# Patient Record
Sex: Female | Born: 1950 | Race: White | Hispanic: No | Marital: Married | Smoking: Never smoker
Health system: Southern US, Community
[De-identification: ages and names within clinical notes are randomized; demographics above are authoritative.]

## PROBLEM LIST (undated history)

## (undated) DIAGNOSIS — I10 Essential (primary) hypertension: Secondary | ICD-10-CM

## (undated) DIAGNOSIS — M79601 Pain in right arm: Secondary | ICD-10-CM

## (undated) DIAGNOSIS — C801 Malignant (primary) neoplasm, unspecified: Secondary | ICD-10-CM

## (undated) DIAGNOSIS — Z853 Personal history of malignant neoplasm of breast: Secondary | ICD-10-CM

## (undated) DIAGNOSIS — Z87820 Personal history of traumatic brain injury: Secondary | ICD-10-CM

## (undated) DIAGNOSIS — E049 Nontoxic goiter, unspecified: Secondary | ICD-10-CM

## (undated) DIAGNOSIS — N840 Polyp of corpus uteri: Secondary | ICD-10-CM

## (undated) DIAGNOSIS — Z9221 Personal history of antineoplastic chemotherapy: Secondary | ICD-10-CM

## (undated) DIAGNOSIS — C50919 Malignant neoplasm of unspecified site of unspecified female breast: Secondary | ICD-10-CM

## (undated) DIAGNOSIS — Z923 Personal history of irradiation: Secondary | ICD-10-CM

---

## 1975-07-03 HISTORY — PX: TUBAL LIGATION: SHX77

## 1997-07-02 DIAGNOSIS — Z923 Personal history of irradiation: Secondary | ICD-10-CM

## 1997-07-02 DIAGNOSIS — Z9221 Personal history of antineoplastic chemotherapy: Secondary | ICD-10-CM

## 1997-07-02 DIAGNOSIS — C50919 Malignant neoplasm of unspecified site of unspecified female breast: Secondary | ICD-10-CM

## 1997-07-02 HISTORY — PX: BREAST BIOPSY: SHX20

## 1997-07-02 HISTORY — PX: BREAST LUMPECTOMY: SHX2

## 1997-07-02 HISTORY — DX: Personal history of irradiation: Z92.3

## 1997-07-02 HISTORY — DX: Malignant neoplasm of unspecified site of unspecified female breast: C50.919

## 1997-07-02 HISTORY — PX: BREAST LUMPECTOMY WITH AXILLARY LYMPH NODE DISSECTION: SHX5756

## 1997-07-02 HISTORY — DX: Personal history of antineoplastic chemotherapy: Z92.21

## 2004-06-13 ENCOUNTER — Ambulatory Visit: Payer: Self-pay | Admitting: Gastroenterology

## 2005-10-31 ENCOUNTER — Ambulatory Visit: Payer: Self-pay | Admitting: Internal Medicine

## 2006-05-06 ENCOUNTER — Ambulatory Visit: Payer: Self-pay | Admitting: Internal Medicine

## 2006-12-03 ENCOUNTER — Ambulatory Visit (HOSPITAL_COMMUNITY): Admission: RE | Admit: 2006-12-03 | Discharge: 2006-12-03 | Payer: Self-pay | Admitting: Obstetrics and Gynecology

## 2006-12-11 ENCOUNTER — Encounter: Admission: RE | Admit: 2006-12-11 | Discharge: 2006-12-11 | Payer: Self-pay | Admitting: Obstetrics and Gynecology

## 2007-02-19 ENCOUNTER — Encounter: Admission: RE | Admit: 2007-02-19 | Discharge: 2007-02-19 | Payer: Self-pay | Admitting: Surgery

## 2007-02-19 ENCOUNTER — Encounter (INDEPENDENT_AMBULATORY_CARE_PROVIDER_SITE_OTHER): Payer: Self-pay | Admitting: Interventional Radiology

## 2007-02-19 ENCOUNTER — Other Ambulatory Visit: Admission: RE | Admit: 2007-02-19 | Discharge: 2007-02-19 | Payer: Self-pay | Admitting: Interventional Radiology

## 2007-06-16 ENCOUNTER — Ambulatory Visit: Payer: Self-pay | Admitting: Gastroenterology

## 2007-12-12 ENCOUNTER — Encounter: Admission: RE | Admit: 2007-12-12 | Discharge: 2007-12-12 | Payer: Self-pay | Admitting: Obstetrics and Gynecology

## 2008-06-16 ENCOUNTER — Ambulatory Visit (HOSPITAL_COMMUNITY): Admission: RE | Admit: 2008-06-16 | Discharge: 2008-06-16 | Payer: Self-pay | Admitting: Obstetrics and Gynecology

## 2008-07-14 ENCOUNTER — Ambulatory Visit: Payer: Self-pay | Admitting: Obstetrics & Gynecology

## 2008-08-02 ENCOUNTER — Ambulatory Visit: Payer: Self-pay | Admitting: Obstetrics & Gynecology

## 2008-12-20 ENCOUNTER — Encounter: Admission: RE | Admit: 2008-12-20 | Discharge: 2008-12-20 | Payer: Self-pay | Admitting: Obstetrics and Gynecology

## 2008-12-23 ENCOUNTER — Encounter: Admission: RE | Admit: 2008-12-23 | Discharge: 2008-12-23 | Payer: Self-pay | Admitting: Obstetrics and Gynecology

## 2009-03-25 DIAGNOSIS — E7800 Pure hypercholesterolemia, unspecified: Secondary | ICD-10-CM | POA: Insufficient documentation

## 2010-02-03 ENCOUNTER — Encounter: Admission: RE | Admit: 2010-02-03 | Discharge: 2010-02-03 | Payer: Self-pay | Admitting: Obstetrics and Gynecology

## 2010-02-09 ENCOUNTER — Encounter: Admission: RE | Admit: 2010-02-09 | Discharge: 2010-02-09 | Payer: Self-pay | Admitting: Obstetrics and Gynecology

## 2010-07-24 ENCOUNTER — Encounter: Payer: Self-pay | Admitting: Obstetrics and Gynecology

## 2011-03-06 ENCOUNTER — Ambulatory Visit: Payer: Self-pay | Admitting: Family Medicine

## 2012-04-30 ENCOUNTER — Ambulatory Visit: Payer: Self-pay | Admitting: Family Medicine

## 2013-05-05 ENCOUNTER — Ambulatory Visit: Payer: Self-pay | Admitting: Family Medicine

## 2014-03-15 DIAGNOSIS — R05 Cough: Secondary | ICD-10-CM | POA: Diagnosis not present

## 2014-03-15 DIAGNOSIS — Z23 Encounter for immunization: Secondary | ICD-10-CM | POA: Diagnosis not present

## 2014-03-15 DIAGNOSIS — R059 Cough, unspecified: Secondary | ICD-10-CM | POA: Diagnosis not present

## 2014-03-15 DIAGNOSIS — Z1331 Encounter for screening for depression: Secondary | ICD-10-CM | POA: Diagnosis not present

## 2014-04-22 DIAGNOSIS — Z23 Encounter for immunization: Secondary | ICD-10-CM | POA: Diagnosis not present

## 2014-05-06 ENCOUNTER — Ambulatory Visit: Payer: Self-pay | Admitting: Family Medicine

## 2014-05-06 DIAGNOSIS — Z853 Personal history of malignant neoplasm of breast: Secondary | ICD-10-CM | POA: Diagnosis not present

## 2014-05-06 DIAGNOSIS — Z1231 Encounter for screening mammogram for malignant neoplasm of breast: Secondary | ICD-10-CM | POA: Diagnosis not present

## 2014-05-20 ENCOUNTER — Other Ambulatory Visit: Payer: Self-pay | Admitting: Obstetrics and Gynecology

## 2014-05-20 DIAGNOSIS — N950 Postmenopausal bleeding: Secondary | ICD-10-CM | POA: Diagnosis not present

## 2014-05-20 DIAGNOSIS — Z206 Contact with and (suspected) exposure to human immunodeficiency virus [HIV]: Secondary | ICD-10-CM | POA: Diagnosis not present

## 2014-05-20 DIAGNOSIS — Z124 Encounter for screening for malignant neoplasm of cervix: Secondary | ICD-10-CM | POA: Diagnosis not present

## 2014-05-20 DIAGNOSIS — Z113 Encounter for screening for infections with a predominantly sexual mode of transmission: Secondary | ICD-10-CM | POA: Diagnosis not present

## 2014-05-25 LAB — CYTOLOGY - PAP

## 2014-06-02 ENCOUNTER — Other Ambulatory Visit (HOSPITAL_COMMUNITY): Payer: Self-pay | Admitting: Obstetrics and Gynecology

## 2014-06-02 DIAGNOSIS — E041 Nontoxic single thyroid nodule: Secondary | ICD-10-CM

## 2014-06-03 DIAGNOSIS — R490 Dysphonia: Secondary | ICD-10-CM | POA: Diagnosis not present

## 2014-06-08 ENCOUNTER — Ambulatory Visit (HOSPITAL_COMMUNITY): Payer: Self-pay

## 2014-06-10 DIAGNOSIS — N950 Postmenopausal bleeding: Secondary | ICD-10-CM | POA: Diagnosis not present

## 2014-07-14 DIAGNOSIS — N840 Polyp of corpus uteri: Secondary | ICD-10-CM | POA: Diagnosis not present

## 2014-07-16 ENCOUNTER — Encounter (HOSPITAL_BASED_OUTPATIENT_CLINIC_OR_DEPARTMENT_OTHER): Payer: Self-pay | Admitting: *Deleted

## 2014-07-16 NOTE — Progress Notes (Signed)
NPO AFTER MN. ARRIVE AT 0700. NEEDS HG. PRE-OP ORDERS PENDING.

## 2014-07-19 NOTE — H&P (Signed)
  Patient name  Bianca Brown, Bianca Brown DICTATION# [id number] CSN# [id number]  [facility], MD 07/19/2014 8:46 AM

## 2014-07-20 DIAGNOSIS — M25511 Pain in right shoulder: Secondary | ICD-10-CM | POA: Diagnosis not present

## 2014-07-20 DIAGNOSIS — M542 Cervicalgia: Secondary | ICD-10-CM | POA: Diagnosis not present
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
## 2014-07-20 NOTE — H&P (Signed)
NAMEAVALEY, COOP NO.:  [id number]  MEDICAL RECORD NO.:  [id number]  LOCATION:                                 FACILITY:  PHYSICIAN:  Darlyn Chamber, M.D.   DATE OF BIRTH:  1950/11/25  DATE OF ADMISSION:  07/21/2014 DATE OF DISCHARGE:                             HISTORY & PHYSICAL   HISTORY:  The patient is a 64 year old postmenopausal patient, presents for hysteroscopy for resection of a large endometrial polyp.  In relation to the present admission, the patient reported postmenopausal bleeding.  She subsequently underwent a saline infusion ultrasound revealed a large endometrial polyp.  She now presents for hysteroscopic evaluation and resection with MyoSure.  ALLERGIES:  No known drug allergies.  MEDICATIONS:  None.  PAST MEDICAL HISTORY:  She has a history of urinary tract infections. Otherwise, no significant medical illnesses.  PREVIOUS SURGERY:  In 1999, she had a history of breast cancer with a lumpectomy.  She had a D and C in 2002 for abnormal bleeding.  She had a previous bilateral tubal ligation in 1977.  FAMILY HISTORY:  Significant in that she has 2 maternal aunts with breast cancer and 1 maternal aunt with ovarian cancer.  SOCIAL HISTORY:  Reveals no tobacco or alcohol use.  REVIEW OF SYSTEMS:  Noncontributory.  PHYSICAL EXAMINATION:  VITAL SIGNS:  The patient is afebrile, stable vital signs.  HEENT:  Pupils equal, round, reactive to light and accommodation.  Extraocular movements were intact.  Sclerae and conjunctivae are clear.  Oropharynx clear. NECK:  Left-sided thyroid nodule, previously evaluated. BREASTS:  Not examined. LUNGS:  Clear. CARDIOVASCULAR SYSTEM:  Regular rate.  No murmurs or gallops.  No carotid or abdominal bruits. ABDOMEN:  Benign.  No masses, organomegaly, or tenderness. PELVIC:  Normal external genitalia.  Vaginal mucosa is clear.  She has a prominent rectocele.  Mild cystocele.  Cervix unremarkable.   Uterus: Normal size, shape, and contour.  Adnexa, free of masses or tenderness.  IMPRESSION: 1. Postmenopausal bleeding with endometrial polyp. 2. Thyroid nodule. 3. Family history of both breast and ovarian cancer. 4. Pelvic relaxation.  PLAN:  At the present time, the patient will undergo cervical dilatation with hysteroscopy and resection with MyoSure.  She also had uterine curetting.  The nature of the procedure and risks have been explained including the risk of infection.  The risk of hemorrhage that could require transfusion with the risk of AIDS or hepatitis.  Excessive bleeding could require hysterectomy.  Risk of perforation with injury to adjacent organs, requiring laparoscopy and possible exploratory laparotomy.  Risk of deep venous thrombosis and pulmonary embolus.  The patient expressed understanding of indications and risks.     Darlyn Chamber, M.D.     JSM/MEDQ  D:  07/19/2014  T:  07/19/2014  Job:  [id number]

## 2014-07-21 ENCOUNTER — Encounter (HOSPITAL_BASED_OUTPATIENT_CLINIC_OR_DEPARTMENT_OTHER)
Admission: RE | Disposition: A | Discharge status: Home / Self Care | Payer: Self-pay | Source: Ambulatory Visit | Attending: Obstetrics and Gynecology

## 2014-07-21 ENCOUNTER — Ambulatory Visit (HOSPITAL_BASED_OUTPATIENT_CLINIC_OR_DEPARTMENT_OTHER): Payer: Medicare Other | Admitting: Anesthesiology

## 2014-07-21 ENCOUNTER — Ambulatory Visit (HOSPITAL_BASED_OUTPATIENT_CLINIC_OR_DEPARTMENT_OTHER)
Admission: RE | Admit: 2014-07-21 | Discharge: 2014-07-21 | Disposition: A | Discharge status: Home / Self Care | Payer: Medicare Other | Source: Ambulatory Visit | Attending: Obstetrics and Gynecology | Admitting: Obstetrics and Gynecology

## 2014-07-21 ENCOUNTER — Encounter (HOSPITAL_BASED_OUTPATIENT_CLINIC_OR_DEPARTMENT_OTHER): Payer: Self-pay | Admitting: Anesthesiology

## 2014-07-21 DIAGNOSIS — Z803 Family history of malignant neoplasm of breast: Secondary | ICD-10-CM | POA: Insufficient documentation

## 2014-07-21 DIAGNOSIS — Z8041 Family history of malignant neoplasm of ovary: Secondary | ICD-10-CM | POA: Insufficient documentation

## 2014-07-21 DIAGNOSIS — N8189 Other female genital prolapse: Secondary | ICD-10-CM | POA: Insufficient documentation

## 2014-07-21 DIAGNOSIS — E041 Nontoxic single thyroid nodule: Secondary | ICD-10-CM | POA: Diagnosis not present

## 2014-07-21 DIAGNOSIS — Z9889 Other specified postprocedural states: Secondary | ICD-10-CM

## 2014-07-21 DIAGNOSIS — N840 Polyp of corpus uteri: Secondary | ICD-10-CM | POA: Diagnosis not present

## 2014-07-21 DIAGNOSIS — Z853 Personal history of malignant neoplasm of breast: Secondary | ICD-10-CM | POA: Insufficient documentation

## 2014-07-21 DIAGNOSIS — N950 Postmenopausal bleeding: Secondary | ICD-10-CM | POA: Diagnosis present

## 2014-07-21 HISTORY — DX: Nontoxic goiter, unspecified: E04.9

## 2014-07-21 HISTORY — DX: Pain in right arm: M79.601

## 2014-07-21 HISTORY — PX: DILATATION & CURETTAGE/HYSTEROSCOPY WITH MYOSURE: SHX6511

## 2014-07-21 HISTORY — DX: Polyp of corpus uteri: N84.0

## 2014-07-21 HISTORY — DX: Personal history of traumatic brain injury: Z87.820

## 2014-07-21 HISTORY — DX: Personal history of malignant neoplasm of breast: Z85.3

## 2014-07-21 LAB — CBC
HEMATOCRIT: 43.7 % (ref 36.0–46.0)
Hemoglobin: 14.8 g/dL (ref 12.0–15.0)
MCH: 29.1 pg (ref 26.0–34.0)
MCHC: 33.9 g/dL (ref 30.0–36.0)
MCV: 85.9 fL (ref 78.0–100.0)
Platelets: 269 10*3/uL (ref 150–400)
RBC: 5.09 MIL/uL (ref 3.87–5.11)
RDW: 13.6 % (ref 11.5–15.5)
WBC: 7.8 10*3/uL (ref 4.0–10.5)

## 2014-07-21 SURGERY — DILATATION & CURETTAGE/HYSTEROSCOPY WITH MYOSURE
Anesthesia: General | Site: Vagina

## 2014-07-21 MED ORDER — DEXAMETHASONE SODIUM PHOSPHATE 4 MG/ML IJ SOLN
INTRAMUSCULAR | Status: DC | PRN
  Administered 2014-07-21: 10 mg via INTRAVENOUS

## 2014-07-21 MED ORDER — KETOROLAC TROMETHAMINE 30 MG/ML IJ SOLN
INTRAMUSCULAR | Status: DC | PRN
  Administered 2014-07-21: 30 mg via INTRAVENOUS

## 2014-07-21 MED ORDER — MIDAZOLAM HCL 2 MG/2ML IJ SOLN
INTRAMUSCULAR | Status: AC
  Filled 2014-07-21: qty 2

## 2014-07-21 MED ORDER — ACETAMINOPHEN 10 MG/ML IV SOLN
INTRAVENOUS | Status: DC | PRN
  Administered 2014-07-21: 1000 mg via INTRAVENOUS

## 2014-07-21 MED ORDER — ONDANSETRON HCL 4 MG/2ML IJ SOLN
INTRAMUSCULAR | Status: DC | PRN
  Administered 2014-07-21: 4 mg via INTRAVENOUS

## 2014-07-21 MED ORDER — OXYCODONE HCL 5 MG PO TABS
5.0000 mg | ORAL_TABLET | Freq: Once | ORAL | Status: AC
  Administered 2014-07-21: 5 mg via ORAL
  Filled 2014-07-21: qty 1

## 2014-07-21 MED ORDER — FENTANYL CITRATE 0.05 MG/ML IJ SOLN
INTRAMUSCULAR | Status: AC
  Filled 2014-07-21: qty 4

## 2014-07-21 MED ORDER — LIDOCAINE HCL (CARDIAC) 20 MG/ML IV SOLN
INTRAVENOUS | Status: DC | PRN
  Administered 2014-07-21: 80 mg via INTRAVENOUS

## 2014-07-21 MED ORDER — ONDANSETRON HCL 4 MG/2ML IJ SOLN
4.0000 mg | Freq: Once | INTRAMUSCULAR | Status: DC | PRN
  Filled 2014-07-21: qty 2

## 2014-07-21 MED ORDER — FENTANYL CITRATE 0.05 MG/ML IJ SOLN
INTRAMUSCULAR | Status: DC | PRN
  Administered 2014-07-21: 50 ug via INTRAVENOUS

## 2014-07-21 MED ORDER — LACTATED RINGERS IV SOLN
INTRAVENOUS | Status: DC
  Administered 2014-07-21: 08:00:00 via INTRAVENOUS
  Filled 2014-07-21: qty 1000

## 2014-07-21 MED ORDER — CEFAZOLIN SODIUM-DEXTROSE 2-3 GM-% IV SOLR
2.0000 g | INTRAVENOUS | Status: AC
  Administered 2014-07-21: 2 g via INTRAVENOUS
  Filled 2014-07-21: qty 50

## 2014-07-21 MED ORDER — OXYCODONE HCL 5 MG PO TABS
ORAL_TABLET | ORAL | Status: AC
  Filled 2014-07-21: qty 1

## 2014-07-21 MED ORDER — SODIUM CHLORIDE 0.9 % IR SOLN
Status: DC | PRN
  Administered 2014-07-21: 3000 mL

## 2014-07-21 MED ORDER — DIPHENHYDRAMINE HCL 50 MG/ML IJ SOLN
INTRAMUSCULAR | Status: DC | PRN
  Administered 2014-07-21: 25 mg via INTRAVENOUS

## 2014-07-21 MED ORDER — FENTANYL CITRATE 0.05 MG/ML IJ SOLN
25.0000 ug | INTRAMUSCULAR | Status: DC | PRN
  Filled 2014-07-21: qty 1

## 2014-07-21 MED ORDER — CEFAZOLIN SODIUM-DEXTROSE 2-3 GM-% IV SOLR
INTRAVENOUS | Status: AC
  Filled 2014-07-21: qty 50

## 2014-07-21 MED ORDER — CHLOROPROCAINE HCL 1 % IJ SOLN
INTRAMUSCULAR | Status: DC | PRN
  Administered 2014-07-21: 20 mL

## 2014-07-21 MED ORDER — EPHEDRINE SULFATE 50 MG/ML IJ SOLN
INTRAMUSCULAR | Status: DC | PRN
  Administered 2014-07-21 (×2): 10 mg via INTRAVENOUS

## 2014-07-21 MED ORDER — MIDAZOLAM HCL 5 MG/5ML IJ SOLN
INTRAMUSCULAR | Status: DC | PRN
  Administered 2014-07-21: 2 mg via INTRAVENOUS

## 2014-07-21 MED ORDER — PROPOFOL 10 MG/ML IV BOLUS
INTRAVENOUS | Status: DC | PRN
  Administered 2014-07-21: 200 mg via INTRAVENOUS

## 2014-07-21 SURGICAL SUPPLY — 37 items
CANISTER SUCTION 2500CC (MISCELLANEOUS) ×3 IMPLANT
CATH ROBINSON RED A/P 16FR (CATHETERS) IMPLANT
CLOTH BEACON ORANGE TIMEOUT ST (SAFETY) ×3 IMPLANT
CORD ACTIVE DISPOSABLE (ELECTRODE)
CORD ELECTRO ACTIVE DISP (ELECTRODE) IMPLANT
COVER TABLE BACK 60X90 (DRAPES) ×3 IMPLANT
DEVICE MYOSURE CLASSIC (MISCELLANEOUS) IMPLANT
DEVICE MYOSURE LITE (MISCELLANEOUS) ×3 IMPLANT
DRAPE CAMERA CLOSED 9X96 (DRAPES) ×3 IMPLANT
DRAPE LG THREE QUARTER DISP (DRAPES) ×3 IMPLANT
DRESSING TELFA ISLAND 4X8 (GAUZE/BANDAGES/DRESSINGS) ×3 IMPLANT
ELECT LOOP GYNE PRO 24FR (CUTTING LOOP)
ELECT REM PT RETURN 9FT ADLT (ELECTROSURGICAL) ×3
ELECT VAPORTRODE GRVD BAR (ELECTRODE) IMPLANT
ELECTRODE LOOP GYNE PRO 24FR (CUTTING LOOP) IMPLANT
ELECTRODE REM PT RTRN 9FT ADLT (ELECTROSURGICAL) ×1 IMPLANT
ELECTRODE VAPORCUT 22FR (ELECTROSURGICAL) IMPLANT
GLOVE BIO SURGEON STRL SZ 6.5 (GLOVE) ×2 IMPLANT
GLOVE BIO SURGEON STRL SZ7 (GLOVE) ×6 IMPLANT
GLOVE BIO SURGEONS STRL SZ 6.5 (GLOVE) ×1
GLOVE BIOGEL PI IND STRL 6.5 (GLOVE) ×1 IMPLANT
GLOVE BIOGEL PI INDICATOR 6.5 (GLOVE) ×2
GOWN PREVENTION PLUS LG XLONG (DISPOSABLE) IMPLANT
GOWN STRL REUS W/TWL LRG LVL3 (GOWN DISPOSABLE) ×3 IMPLANT
GOWN STRL REUS W/TWL XL LVL3 (GOWN DISPOSABLE) ×3 IMPLANT
LEGGING LITHOTOMY PAIR STRL (DRAPES) ×3 IMPLANT
NEEDLE SPNL 18GX3.5 QUINCKE PK (NEEDLE) ×3 IMPLANT
PACK BASIN DAY SURGERY FS (CUSTOM PROCEDURE TRAY) ×3 IMPLANT
PAD OB MATERNITY 4.3X12.25 (PERSONAL CARE ITEMS) ×3 IMPLANT
PAD PREP 24X48 CUFFED NSTRL (MISCELLANEOUS) ×3 IMPLANT
SEAL ROD LENS SCOPE MYOSURE (ABLATOR) ×3 IMPLANT
SYR CONTROL 10ML LL (SYRINGE) ×3 IMPLANT
TOWEL OR 17X24 6PK STRL BLUE (TOWEL DISPOSABLE) ×6 IMPLANT
TUBE HYSTEROSCOPY W Y-CONNECT (TUBING) IMPLANT
TUBING AQUILEX INFLOW (TUBING) ×3 IMPLANT
TUBING AQUILEX OUTFLOW (TUBING) ×9 IMPLANT
WATER STERILE IRR 500ML POUR (IV SOLUTION) ×3 IMPLANT

## 2014-07-21 NOTE — H&P (Signed)
  History and physical exam unchanged 

## 2014-07-21 NOTE — Brief Op Note (Signed)
07/21/2014  8:57 AM  PATIENT:  Richard Miu  64 y.o. female  PRE-OPERATIVE DIAGNOSIS:  POLYP  POST-OPERATIVE DIAGNOSIS:  POLYP  PROCEDURE:  Procedure(s): DILATATION & CURETTAGE/HYSTEROSCOPY WITH MYOSURE (N/A)  SURGEON:  Surgeon(s) and Role:    * Darlyn Chamber, MD - Primary  PHYSICIAN ASSISTANT:   ASSISTANTS: none   ANESTHESIA:   general and paracervical block  EBL:  Total I/O In: 200 [I.V.:200] Out: -   BLOOD ADMINISTERED:none  DRAINS: none   LOCAL MEDICATIONS USED:  OTHER nesicaine  SPECIMEN:  Source of Specimen:  endometrial polyp and currettings  DISPOSITION OF SPECIMEN:  PATHOLOGY  COUNTS:  YES  TOURNIQUET:  * No tourniquets in log *  DICTATION: .Other Dictation: Dictation Number [id number]  PLAN OF CARE: Discharge to home after PACU  PATIENT DISPOSITION:  PACU - hemodynamically stable.   Delay start of Pharmacological VTE agent (>24hrs) due to surgical blood loss or risk of bleeding: not applicable

## 2014-07-21 NOTE — Anesthesia Procedure Notes (Signed)
Procedure Name: LMA Insertion Date/Time: 07/21/2014 8:31 AM Performed by: Mechele Claude Pre-anesthesia Checklist: Patient identified, Emergency Drugs available, Suction available and Patient being monitored Patient Re-evaluated:Patient Re-evaluated prior to inductionOxygen Delivery Method: Circle System Utilized Preoxygenation: Pre-oxygenation with 100% oxygen Intubation Type: IV induction Ventilation: Mask ventilation without difficulty LMA: LMA inserted LMA Size: 4.0 Number of attempts: 1 Airway Equipment and Method: bite block Placement Confirmation: positive ETCO2 Tube secured with: Tape Dental Injury: Teeth and Oropharynx as per pre-operative assessment

## 2014-07-21 NOTE — Transfer of Care (Signed)
Immediate Anesthesia Transfer of Care Note  Patient: Bianca Brown  Procedure(s) Performed: Procedure(s) (LRB): DILATATION & CURETTAGE/HYSTEROSCOPY WITH MYOSURE (N/A)  Patient Location: PACU  Anesthesia Type: General  Level of Consciousness: awake, alert  and oriented  Airway & Oxygen Therapy: Patient Spontanous Breathing and Patient connected to face mask oxygen  Post-op Assessment: Report given to PACU RN and Post -op Vital signs reviewed and stable  Post vital signs: Reviewed and stable  Complications: No apparent anesthesia complications

## 2014-07-21 NOTE — Anesthesia Postprocedure Evaluation (Signed)
  Anesthesia Post-op Note  Patient: Bianca Brown  Procedure(s) Performed: Procedure(s) (LRB): DILATATION & CURETTAGE/HYSTEROSCOPY WITH MYOSURE (N/A)  Patient Location: PACU  Anesthesia Type: General  Level of Consciousness: awake and alert   Airway and Oxygen Therapy: Patient Spontanous Breathing  Post-op Pain: mild  Post-op Assessment: Post-op Vital signs reviewed, Patient's Cardiovascular Status Stable, Respiratory Function Stable, Patent Airway and No signs of Nausea or vomiting  Last Vitals:  Filed Vitals:   07/21/14 0930  BP: 130/84  Pulse: 91  Temp:   Resp: 18    Post-op Vital Signs: stable   Complications: No apparent anesthesia complications

## 2014-07-21 NOTE — Discharge Instructions (Signed)
° °  D & C Home care Instructions: ° ° °Personal hygiene:  Used sanitary napkins for vaginal drainage not tampons. Shower or tub bathe the day after your procedure. No douching until bleeding stops. Always wipe from front to back after  Elimination. ° °Activity: Do not drive or operate any equipment today. The effects of the anesthesia are still present and drowsiness may result. Rest today, not necessarily flat bed rest, just take it easy. You may resume your normal activity in one to 2 days. ° °Sexual activity: No intercourse for one week or as indicated by your physician ° °Diet: Eat a light diet as desired this evening. You may resume a regular diet tomorrow. ° °Return to work: One to 2 days. ° °General Expectations of your surgery: Vaginal bleeding should be no heavier than a normal period. Spotting may continue up to 10 days. Mild cramps may continue for a couple of days. You may have a regular period in 2-6 weeks. ° °Unexpected observations call your doctor if these occur: persistent or heavy bleeding. Severe abdominal cramping or pain. Elevation of temperature greater than 100.4 °F. ° °Call for an appointment in one week. ° ° ° ° ° °Post Anesthesia Home Care Instructions ° °Activity: °Get plenty of rest for the remainder of the day. A responsible adult should stay with you for 24 hours following the procedure.  °For the next 24 hours, DO NOT: °-Drive a car °-Operate machinery °-Drink alcoholic beverages °-Take any medication unless instructed by your physician °-Make any legal decisions or sign important papers. ° °Meals: °Start with liquid foods such as gelatin or soup. Progress to regular foods as tolerated. Avoid greasy, spicy, heavy foods. If nausea and/or vomiting occur, drink only clear liquids until the nausea and/or vomiting subsides. Call your physician if vomiting continues. ° °Special Instructions/Symptoms: °Your throat may feel dry or sore from the anesthesia or the breathing tube placed in your  throat during surgery. If this causes discomfort, gargle with warm salt water. The discomfort should disappear within 24 hours. ° °

## 2014-07-21 NOTE — Op Note (Signed)
[name], Bianca Brown               ACCOUNT NO.:  [id number]  MEDICAL RECORD NO.:  [id number]  LOCATION:                                 FACILITY:  PHYSICIAN:  Darlyn Chamber, M.D.   DATE OF BIRTH:  29-Apr-1951  DATE OF PROCEDURE:  07/21/2014 DATE OF DISCHARGE:  07/21/2014                              OPERATIVE REPORT   PREOPERATIVE DIAGNOSIS:  Endometrial polyp.  POSTOPERATIVE DIAGNOSIS:  Endometrial polyp.  PROCEDURE:  Paracervical block.  Cervical dilation.  Hysteroscopy. MyoSure resection of a large endometrial polyp.  Endometrial curettings.  SURGEON:  Darlyn Chamber, MD  ANESTHESIA:  Paracervical block with sedation.  BLOOD LOSS:  Minimal.  PACKS AND DRAINS:  None.  INTRAOPERATIVE BLOOD PLACED:  None.  COMPLICATIONS:  None.  INDICATION:  Dictated in history and physical.  PROCEDURE IN DETAIL:  As follows; the patient was taken to the OR, placed in supine position.  After satisfactory level of general anesthesia obtained, the patient was placed in dorsal supine position using the Allen stirrups.  Perineum and vagina were prepped out with Betadine.  The patient was draped in sterile field.  A speculum was placed in the vaginal vault.  The anterior lip of the cervix was grasped with single-tooth tenaculum.  Paracervical block 1% Nesacaine was instituted.  Approximately 20 mL were used.  Uterus sounded 9 cm. Cervix was dilated to a size 23 Pratt dilator.  Hysteroscope was introduced.  Intrauterine cavity was distended using saline. Visualization revealed a fairly large posterior wall uterine polyp. MyoSure was brought in place and the polyp was completely resected.  We also did multiple endometrial biopsies.  All of the endometrium was very atrophic.  At the end of the procedure, total deficit was 250 mL.  There was no signs of perforation.  No active bleeding was noted.  The hysteroscope along with the MyoSure removed.  Endometrial curettings were obtained and sent  for cytology. Single-tooth tenaculum and spec were then removed.  The patient was taken out of the dorsal supine position.  Once alert and extubated, transferred to recovery room in good condition.  Sponge, instrument, and needle count was correct by circulating nurse.     Darlyn Chamber, M.D.     JSM/MEDQ  D:  07/21/2014  T:  07/21/2014  Job:  [id number]

## 2014-07-22 ENCOUNTER — Encounter (HOSPITAL_BASED_OUTPATIENT_CLINIC_OR_DEPARTMENT_OTHER): Payer: Self-pay | Admitting: Obstetrics and Gynecology

## 2014-07-27 DIAGNOSIS — M542 Cervicalgia: Secondary | ICD-10-CM | POA: Diagnosis not present

## 2014-07-27 DIAGNOSIS — M25511 Pain in right shoulder: Secondary | ICD-10-CM | POA: Diagnosis not present

## 2014-07-27 DIAGNOSIS — M79601 Pain in right arm: Secondary | ICD-10-CM | POA: Diagnosis not present

## 2014-07-27 DIAGNOSIS — M5010 Cervical disc disorder with radiculopathy, unspecified cervical region: Secondary | ICD-10-CM | POA: Diagnosis not present

## 2014-07-29 DIAGNOSIS — Z09 Encounter for follow-up examination after completed treatment for conditions other than malignant neoplasm: Secondary | ICD-10-CM | POA: Diagnosis not present

## 2014-08-03 DIAGNOSIS — M5010 Cervical disc disorder with radiculopathy, unspecified cervical region: Secondary | ICD-10-CM | POA: Diagnosis not present

## 2014-08-03 DIAGNOSIS — M79601 Pain in right arm: Secondary | ICD-10-CM | POA: Diagnosis not present

## 2014-08-03 DIAGNOSIS — M25511 Pain in right shoulder: Secondary | ICD-10-CM | POA: Diagnosis not present

## 2014-08-03 DIAGNOSIS — M542 Cervicalgia: Secondary | ICD-10-CM | POA: Diagnosis not present

## 2014-09-24 DIAGNOSIS — R040 Epistaxis: Secondary | ICD-10-CM | POA: Diagnosis not present

## 2014-10-25 DIAGNOSIS — R040 Epistaxis: Secondary | ICD-10-CM | POA: Diagnosis not present

## 2014-10-25 DIAGNOSIS — J340 Abscess, furuncle and carbuncle of nose: Secondary | ICD-10-CM | POA: Diagnosis not present

## 2014-11-22 DIAGNOSIS — H43813 Vitreous degeneration, bilateral: Secondary | ICD-10-CM | POA: Diagnosis not present

## 2015-04-21 ENCOUNTER — Other Ambulatory Visit: Payer: Self-pay

## 2015-04-21 ENCOUNTER — Ambulatory Visit (INDEPENDENT_AMBULATORY_CARE_PROVIDER_SITE_OTHER): Payer: Medicare Other

## 2015-04-21 DIAGNOSIS — Z23 Encounter for immunization: Secondary | ICD-10-CM

## 2015-04-21 MED ORDER — PNEUMOCOCCAL 13-VAL CONJ VACC IM SUSP: 0.5000 mL | INTRAMUSCULAR | Status: DC

## 2015-04-21 MED ORDER — PNEUMOCOCCAL 13-VAL CONJ VACC IM SUSP
0.5000 mL | Freq: Once | INTRAMUSCULAR | Status: AC
  Administered 2015-04-21: 0.5 mL via INTRAMUSCULAR

## 2015-05-24 DIAGNOSIS — Z6826 Body mass index (BMI) 26.0-26.9, adult: Secondary | ICD-10-CM | POA: Diagnosis not present

## 2015-05-24 DIAGNOSIS — R3915 Urgency of urination: Secondary | ICD-10-CM | POA: Diagnosis not present

## 2015-05-24 DIAGNOSIS — R350 Frequency of micturition: Secondary | ICD-10-CM | POA: Diagnosis not present

## 2015-05-24 DIAGNOSIS — Z01419 Encounter for gynecological examination (general) (routine) without abnormal findings: Secondary | ICD-10-CM | POA: Diagnosis not present

## 2015-05-25 ENCOUNTER — Other Ambulatory Visit: Payer: Self-pay | Admitting: Obstetrics and Gynecology

## 2015-05-25 DIAGNOSIS — E049 Nontoxic goiter, unspecified: Secondary | ICD-10-CM

## 2015-05-25 DIAGNOSIS — E041 Nontoxic single thyroid nodule: Secondary | ICD-10-CM

## 2015-05-25 DIAGNOSIS — Z1231 Encounter for screening mammogram for malignant neoplasm of breast: Secondary | ICD-10-CM

## 2015-05-31 ENCOUNTER — Ambulatory Visit
Admission: RE | Admit: 2015-05-31 | Discharge: 2015-05-31 | Disposition: A | Discharge status: Home / Self Care | Payer: Medicare Other | Source: Ambulatory Visit | Attending: Obstetrics and Gynecology | Admitting: Obstetrics and Gynecology

## 2015-05-31 DIAGNOSIS — R591 Generalized enlarged lymph nodes: Secondary | ICD-10-CM | POA: Diagnosis not present

## 2015-05-31 DIAGNOSIS — E042 Nontoxic multinodular goiter: Secondary | ICD-10-CM | POA: Diagnosis not present

## 2015-05-31 DIAGNOSIS — E041 Nontoxic single thyroid nodule: Secondary | ICD-10-CM

## 2015-05-31 DIAGNOSIS — E049 Nontoxic goiter, unspecified: Secondary | ICD-10-CM | POA: Diagnosis present

## 2015-06-06 DIAGNOSIS — Z13220 Encounter for screening for lipoid disorders: Secondary | ICD-10-CM | POA: Diagnosis not present

## 2015-06-06 DIAGNOSIS — M8588 Other specified disorders of bone density and structure, other site: Secondary | ICD-10-CM | POA: Diagnosis not present

## 2015-06-06 DIAGNOSIS — Z13820 Encounter for screening for osteoporosis: Secondary | ICD-10-CM | POA: Diagnosis not present

## 2015-06-06 DIAGNOSIS — N958 Other specified menopausal and perimenopausal disorders: Secondary | ICD-10-CM | POA: Diagnosis not present

## 2015-06-06 DIAGNOSIS — Z131 Encounter for screening for diabetes mellitus: Secondary | ICD-10-CM | POA: Diagnosis not present

## 2015-06-06 DIAGNOSIS — E041 Nontoxic single thyroid nodule: Secondary | ICD-10-CM | POA: Diagnosis not present

## 2015-06-06 DIAGNOSIS — M8580 Other specified disorders of bone density and structure, unspecified site: Secondary | ICD-10-CM | POA: Diagnosis not present

## 2015-06-07 ENCOUNTER — Ambulatory Visit
Admission: RE | Admit: 2015-06-07 | Discharge: 2015-06-07 | Disposition: A | Discharge status: Home / Self Care | Payer: Medicare Other | Source: Ambulatory Visit | Attending: Obstetrics and Gynecology | Admitting: Obstetrics and Gynecology

## 2015-06-07 ENCOUNTER — Other Ambulatory Visit: Payer: Self-pay | Admitting: Obstetrics and Gynecology

## 2015-06-07 DIAGNOSIS — Z1231 Encounter for screening mammogram for malignant neoplasm of breast: Secondary | ICD-10-CM | POA: Diagnosis not present

## 2015-06-07 DIAGNOSIS — Z853 Personal history of malignant neoplasm of breast: Secondary | ICD-10-CM | POA: Insufficient documentation

## 2015-06-07 HISTORY — DX: Malignant (primary) neoplasm, unspecified: C80.1

## 2015-06-29 DIAGNOSIS — E042 Nontoxic multinodular goiter: Secondary | ICD-10-CM | POA: Diagnosis not present

## 2015-06-29 DIAGNOSIS — Z808 Family history of malignant neoplasm of other organs or systems: Secondary | ICD-10-CM | POA: Diagnosis not present

## 2015-06-29 DIAGNOSIS — Z853 Personal history of malignant neoplasm of breast: Secondary | ICD-10-CM | POA: Diagnosis not present

## 2015-07-05 ENCOUNTER — Other Ambulatory Visit: Payer: Self-pay | Admitting: Surgery

## 2015-07-05 DIAGNOSIS — E041 Nontoxic single thyroid nodule: Secondary | ICD-10-CM

## 2015-07-13 ENCOUNTER — Other Ambulatory Visit: Payer: Self-pay | Admitting: Surgery

## 2015-07-13 DIAGNOSIS — E041 Nontoxic single thyroid nodule: Secondary | ICD-10-CM

## 2015-07-28 DIAGNOSIS — D2339 Other benign neoplasm of skin of other parts of face: Secondary | ICD-10-CM | POA: Diagnosis not present

## 2015-07-28 DIAGNOSIS — L821 Other seborrheic keratosis: Secondary | ICD-10-CM | POA: Diagnosis not present

## 2015-07-28 DIAGNOSIS — D1801 Hemangioma of skin and subcutaneous tissue: Secondary | ICD-10-CM | POA: Diagnosis not present

## 2015-07-28 DIAGNOSIS — D2372 Other benign neoplasm of skin of left lower limb, including hip: Secondary | ICD-10-CM | POA: Diagnosis not present

## 2015-08-03 ENCOUNTER — Ambulatory Visit
Admission: RE | Admit: 2015-08-03 | Discharge: 2015-08-03 | Disposition: A | Discharge status: Home / Self Care | Payer: Medicare Other | Source: Ambulatory Visit | Attending: Surgery | Admitting: Surgery

## 2015-08-03 ENCOUNTER — Other Ambulatory Visit (HOSPITAL_COMMUNITY)
Admission: RE | Admit: 2015-08-03 | Discharge: 2015-08-03 | Disposition: A | Discharge status: Home / Self Care | Payer: BC Managed Care – PPO | Source: Ambulatory Visit | Attending: Radiology | Admitting: Radiology

## 2015-08-03 ENCOUNTER — Other Ambulatory Visit (HOSPITAL_COMMUNITY)
Admission: RE | Admit: 2015-08-03 | Discharge: 2015-08-03 | Disposition: A | Discharge status: Home / Self Care | Payer: Medicare Other | Source: Ambulatory Visit | Attending: Radiology | Admitting: Radiology

## 2015-08-03 DIAGNOSIS — E042 Nontoxic multinodular goiter: Secondary | ICD-10-CM | POA: Diagnosis not present

## 2015-08-03 DIAGNOSIS — E041 Nontoxic single thyroid nodule: Secondary | ICD-10-CM | POA: Insufficient documentation

## 2016-02-27 ENCOUNTER — Other Ambulatory Visit: Payer: Self-pay

## 2016-03-30 ENCOUNTER — Ambulatory Visit (INDEPENDENT_AMBULATORY_CARE_PROVIDER_SITE_OTHER): Payer: Medicare Other | Admitting: Family Medicine

## 2016-03-30 ENCOUNTER — Encounter: Payer: Self-pay | Admitting: Family Medicine

## 2016-03-30 VITALS — BP 124/82 | HR 88 | Temp 98.0°F | Resp 16 | Wt 173.0 lb

## 2016-03-30 DIAGNOSIS — H6983 Other specified disorders of Eustachian tube, bilateral: Secondary | ICD-10-CM

## 2016-03-30 DIAGNOSIS — J301 Allergic rhinitis due to pollen: Secondary | ICD-10-CM | POA: Diagnosis not present

## 2016-03-30 DIAGNOSIS — J309 Allergic rhinitis, unspecified: Secondary | ICD-10-CM | POA: Insufficient documentation

## 2016-03-30 DIAGNOSIS — Z8659 Personal history of other mental and behavioral disorders: Secondary | ICD-10-CM | POA: Insufficient documentation

## 2016-03-30 DIAGNOSIS — J40 Bronchitis, not specified as acute or chronic: Secondary | ICD-10-CM | POA: Diagnosis not present

## 2016-03-30 MED ORDER — HYDROCODONE-HOMATROPINE 5-1.5 MG/5ML PO SYRP: ORAL_SOLUTION | ORAL | 0 refills | Status: DC

## 2016-03-30 MED ORDER — FLUTICASONE PROPIONATE 50 MCG/ACT NA SUSP: 2.0000 | Freq: Every day | NASAL | 6 refills | Status: DC

## 2016-03-30 MED ORDER — CEFDINIR 300 MG PO CAPS: 300.0000 mg | ORAL_CAPSULE | Freq: Two times a day (BID) | ORAL | 0 refills | Status: DC

## 2016-03-30 NOTE — Progress Notes (Signed)
Subjective:     Patient ID: Bianca Brown, female   DOB: 01-Mar-1951, 65 y.o.   MRN: [id number]  HPI  Chief Complaint  Patient presents with  . Cough    congestion for 1 month. Ear fullness   States while on a trip to [place] one month go developed a high fever and cough. Placed on Zithromax and cough syrup. States now she has persistent ear congestion-"like hearing in a tunnel"-, PND, and cough productive of greenish sputum. Reports fall allergies as well.   Review of Systems     Objective:   Physical Exam  Constitutional: She appears well-developed and well-nourished. No distress.  Ears: T.M's intact without inflammation; dull in appearance Throat: no tonsillar enlargement or exudate Neck: no cervical adenopathy Lungs: clear     Assessment:    1. Eustachian tube dysfunction, bilatera - fluticasone (FLONASE) 50 MCG/ACT nasal spray; Place 2 sprays into both nostrils daily.  Dispense: 16 g; Refill: 6  2. Bronchitis - cefdinir (OMNICEF) 300 MG capsule; Take 1 capsule (300 mg total) by mouth 2 (two) times daily.  Dispense: 14 capsule; Refill: 0 - HYDROcodone-homatropine (HYCODAN) 5-1.5 MG/5ML syrup; 5 ml 4-6 hours as needed for cough  Dispense: 240 mL; Refill: 0  3. Allergic rhinitis due to pollen - fluticasone (FLONASE) 50 MCG/ACT nasal spray; Place 2 sprays into both nostrils daily.  Dispense: 16 g; Refill: 6    Plan:    Further f/u if not improving.

## 2016-03-30 NOTE — Patient Instructions (Signed)
Let us know if you are not improving. 

## 2016-04-11 ENCOUNTER — Telehealth: Payer: Self-pay

## 2016-04-11 ENCOUNTER — Encounter: Payer: Self-pay | Admitting: Family Medicine

## 2016-04-11 ENCOUNTER — Ambulatory Visit (INDEPENDENT_AMBULATORY_CARE_PROVIDER_SITE_OTHER): Payer: Medicare Other | Admitting: Family Medicine

## 2016-04-11 ENCOUNTER — Ambulatory Visit
Admission: RE | Admit: 2016-04-11 | Discharge: 2016-04-11 | Disposition: A | Discharge status: Home / Self Care | Payer: Medicare Other | Source: Ambulatory Visit | Attending: Family Medicine | Admitting: Family Medicine

## 2016-04-11 VITALS — BP 138/86 | HR 86 | Temp 98.0°F | Resp 14 | Wt 170.0 lb

## 2016-04-11 DIAGNOSIS — R05 Cough: Secondary | ICD-10-CM | POA: Diagnosis not present

## 2016-04-11 DIAGNOSIS — R059 Cough, unspecified: Secondary | ICD-10-CM

## 2016-04-11 DIAGNOSIS — R937 Abnormal findings on diagnostic imaging of other parts of musculoskeletal system: Secondary | ICD-10-CM | POA: Insufficient documentation

## 2016-04-11 DIAGNOSIS — R079 Chest pain, unspecified: Secondary | ICD-10-CM | POA: Diagnosis not present

## 2016-04-11 MED ORDER — FLUTICASONE-SALMETEROL 250-50 MCG/DOSE IN AEPB: 1.0000 | INHALATION_SPRAY | Freq: Two times a day (BID) | RESPIRATORY_TRACT | 0 refills | Status: DC
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
## 2016-04-11 NOTE — Telephone Encounter (Signed)
Erline Levine from [facility] called and states Chest Xray showed no pneumothorax or any abnormalities except for possible right rib fracture. They faxing report over now-aa

## 2016-04-11 NOTE — Patient Instructions (Signed)
Try Robitussin or an expectorant and plenty of water with it.

## 2016-04-16 ENCOUNTER — Telehealth: Payer: Self-pay

## 2016-04-16 NOTE — Telephone Encounter (Signed)
She should finish medications that Dr. Rosanna Randy gave her. If her cough or pain returns after finishing medication, she should come back for further tests.

## 2016-04-16 NOTE — Telephone Encounter (Signed)
Spoke with patient advised of her chest xray that showed possible rib fracture. Patient states she is feeling better and does she need to keep appointment with Dr Caryn Section for the possible fracture?-Bianca Brown

## 2016-04-16 NOTE — Telephone Encounter (Signed)
Patient was notified. Expressed understanding.  

## 2016-04-26 ENCOUNTER — Ambulatory Visit: Payer: Medicare Other | Admitting: Family Medicine

## 2016-05-02 ENCOUNTER — Other Ambulatory Visit: Payer: Self-pay | Admitting: Obstetrics and Gynecology

## 2016-05-02 DIAGNOSIS — Z1231 Encounter for screening mammogram for malignant neoplasm of breast: Secondary | ICD-10-CM

## 2016-05-03 ENCOUNTER — Ambulatory Visit (INDEPENDENT_AMBULATORY_CARE_PROVIDER_SITE_OTHER): Payer: Medicare Other

## 2016-05-03 DIAGNOSIS — Z23 Encounter for immunization: Secondary | ICD-10-CM | POA: Diagnosis not present

## 2016-05-30 DIAGNOSIS — Z853 Personal history of malignant neoplasm of breast: Secondary | ICD-10-CM | POA: Diagnosis not present

## 2016-05-30 DIAGNOSIS — Z1321 Encounter for screening for nutritional disorder: Secondary | ICD-10-CM | POA: Diagnosis not present

## 2016-05-30 DIAGNOSIS — Z6827 Body mass index (BMI) 27.0-27.9, adult: Secondary | ICD-10-CM | POA: Diagnosis not present

## 2016-05-30 DIAGNOSIS — N8189 Other female genital prolapse: Secondary | ICD-10-CM | POA: Diagnosis not present

## 2016-05-30 DIAGNOSIS — Z124 Encounter for screening for malignant neoplasm of cervix: Secondary | ICD-10-CM | POA: Diagnosis not present

## 2016-05-30 DIAGNOSIS — R351 Nocturia: Secondary | ICD-10-CM | POA: Diagnosis not present

## 2016-05-30 DIAGNOSIS — Z13220 Encounter for screening for lipoid disorders: Secondary | ICD-10-CM | POA: Diagnosis not present

## 2016-05-30 LAB — LIPID PANEL
CHOL/HDL RATIO: 5.5
Cholesterol: 259 mg/dL — AB (ref 0–200)
HDL: 47 mg/dL (ref 35–70)
LDL CALC: 180 mg/dL
TRIGLYCERIDES: 162 mg/dL — AB (ref 40–160)
VLDL: 32 mg/dL
Vit D, 25-Hydroxy: 28

## 2016-06-06 ENCOUNTER — Encounter: Payer: Self-pay | Admitting: *Deleted

## 2016-06-06 ENCOUNTER — Telehealth: Payer: Self-pay | Admitting: Family Medicine

## 2016-06-06 ENCOUNTER — Encounter: Payer: Self-pay | Admitting: Family Medicine

## 2016-06-06 MED ORDER — PRAVASTATIN SODIUM 40 MG PO TABS
40.0000 mg | ORAL_TABLET | Freq: Every day | ORAL | 3 refills | Status: DC
Start: 2016-06-06 — End: 2016-09-06

## 2016-06-06 NOTE — Telephone Encounter (Signed)
Patient was notified of results. Expressed understanding. Rx sent to pharmacy. Patient scheduled f/u for September 05 2016.

## 2016-06-06 NOTE — Telephone Encounter (Signed)
Please advise patient that we received copy of labs from Dr. Radene Knee and her cholesterol is very high at 259, should be under 200. Recommend she start pravastatin 40mg  daily, #30, rf x 3 and schedule follow up in 3 months.

## 2016-06-07 ENCOUNTER — Ambulatory Visit
Admission: RE | Admit: 2016-06-07 | Discharge: 2016-06-07 | Disposition: A | Discharge status: Home / Self Care | Payer: Medicare Other | Source: Ambulatory Visit | Attending: Obstetrics and Gynecology | Admitting: Obstetrics and Gynecology

## 2016-06-07 DIAGNOSIS — R928 Other abnormal and inconclusive findings on diagnostic imaging of breast: Secondary | ICD-10-CM | POA: Diagnosis not present

## 2016-06-07 DIAGNOSIS — Z1231 Encounter for screening mammogram for malignant neoplasm of breast: Secondary | ICD-10-CM | POA: Diagnosis not present

## 2016-06-07 HISTORY — DX: Malignant neoplasm of unspecified site of unspecified female breast: C50.919

## 2016-06-11 ENCOUNTER — Other Ambulatory Visit: Payer: Self-pay | Admitting: Obstetrics and Gynecology

## 2016-06-11 DIAGNOSIS — N6489 Other specified disorders of breast: Secondary | ICD-10-CM

## 2016-06-11 DIAGNOSIS — R928 Other abnormal and inconclusive findings on diagnostic imaging of breast: Secondary | ICD-10-CM

## 2016-07-05 ENCOUNTER — Other Ambulatory Visit: Payer: Medicare Other

## 2016-07-05 ENCOUNTER — Ambulatory Visit: Payer: Medicare Other

## 2016-07-17 ENCOUNTER — Ambulatory Visit
Admission: RE | Admit: 2016-07-17 | Discharge: 2016-07-17 | Disposition: A | Discharge status: Home / Self Care | Payer: Medicare Other | Source: Ambulatory Visit | Attending: Obstetrics and Gynecology | Admitting: Obstetrics and Gynecology

## 2016-07-17 DIAGNOSIS — N6489 Other specified disorders of breast: Secondary | ICD-10-CM | POA: Insufficient documentation

## 2016-07-17 DIAGNOSIS — N6310 Unspecified lump in the right breast, unspecified quadrant: Secondary | ICD-10-CM | POA: Diagnosis not present

## 2016-07-17 DIAGNOSIS — R928 Other abnormal and inconclusive findings on diagnostic imaging of breast: Secondary | ICD-10-CM

## 2016-07-20 ENCOUNTER — Other Ambulatory Visit: Payer: Self-pay | Admitting: Obstetrics and Gynecology

## 2016-07-20 DIAGNOSIS — R928 Other abnormal and inconclusive findings on diagnostic imaging of breast: Secondary | ICD-10-CM

## 2016-07-20 DIAGNOSIS — N6310 Unspecified lump in the right breast, unspecified quadrant: Secondary | ICD-10-CM

## 2016-07-31 ENCOUNTER — Ambulatory Visit
Admission: RE | Admit: 2016-07-31 | Discharge: 2016-07-31 | Disposition: A | Discharge status: Home / Self Care | Payer: Medicare Other | Source: Ambulatory Visit | Attending: Obstetrics and Gynecology | Admitting: Obstetrics and Gynecology

## 2016-07-31 ENCOUNTER — Other Ambulatory Visit: Payer: Self-pay | Admitting: Obstetrics and Gynecology

## 2016-07-31 DIAGNOSIS — R928 Other abnormal and inconclusive findings on diagnostic imaging of breast: Secondary | ICD-10-CM | POA: Diagnosis present

## 2016-07-31 DIAGNOSIS — N6310 Unspecified lump in the right breast, unspecified quadrant: Secondary | ICD-10-CM

## 2016-07-31 DIAGNOSIS — N6011 Diffuse cystic mastopathy of right breast: Secondary | ICD-10-CM | POA: Diagnosis not present

## 2016-07-31 DIAGNOSIS — N6031 Fibrosclerosis of right breast: Secondary | ICD-10-CM | POA: Diagnosis not present

## 2016-07-31 DIAGNOSIS — N6091 Unspecified benign mammary dysplasia of right breast: Secondary | ICD-10-CM | POA: Insufficient documentation

## 2016-07-31 HISTORY — PX: BREAST BIOPSY: SHX20

## 2016-07-31 HISTORY — PX: BREAST CYST ASPIRATION: SHX578

## 2016-08-01 LAB — SURGICAL PATHOLOGY

## 2016-08-03 ENCOUNTER — Other Ambulatory Visit: Payer: Self-pay | Admitting: Surgery

## 2016-08-03 DIAGNOSIS — E041 Nontoxic single thyroid nodule: Secondary | ICD-10-CM

## 2016-08-09 ENCOUNTER — Ambulatory Visit
Admission: RE | Admit: 2016-08-09 | Discharge: 2016-08-09 | Disposition: A | Discharge status: Home / Self Care | Payer: TRICARE For Life (TFL) | Source: Ambulatory Visit | Attending: Surgery | Admitting: Surgery

## 2016-08-09 DIAGNOSIS — E041 Nontoxic single thyroid nodule: Secondary | ICD-10-CM

## 2016-09-05 ENCOUNTER — Ambulatory Visit (INDEPENDENT_AMBULATORY_CARE_PROVIDER_SITE_OTHER): Payer: Medicare Other | Admitting: Family Medicine

## 2016-09-05 ENCOUNTER — Encounter: Payer: Self-pay | Admitting: Family Medicine

## 2016-09-05 VITALS — BP 132/78 | HR 68 | Temp 98.6°F | Resp 16 | Wt 178.0 lb

## 2016-09-05 DIAGNOSIS — Z6828 Body mass index (BMI) 28.0-28.9, adult: Secondary | ICD-10-CM | POA: Diagnosis not present

## 2016-09-05 DIAGNOSIS — E7800 Pure hypercholesterolemia, unspecified: Secondary | ICD-10-CM

## 2016-09-05 NOTE — Patient Instructions (Signed)
   Recommend taking 81mg enteric coated aspirin to reduce risk of vascular events such as heart attacks and strokes.    

## 2016-09-05 NOTE — Progress Notes (Signed)
Patient: Bianca Brown Female    DOB: March 18, 1951   66 y.o.   MRN: [id number] Visit Date: 09/05/2016  Today's Provider: Lelon Huh, MD   Chief Complaint  Patient presents with  . Hyperlipidemia   Subjective:    HPI  Lipid/Cholesterol, Follow-up:   Last seen for this3 months ago.  Management changes since that visit include adding pravastatin 40mg  daily. . Last Lipid Panel:    Component Value Date/Time   CHOL 259 (A) 05/30/2016   TRIG 162 (A) 05/30/2016   HDL 47 05/30/2016   CHOLHDL 5.5 05/30/2016   VLDL 32 05/30/2016   LDLCALC 180 05/30/2016     She reports good compliance with treatment. She is not having side effects.  Current symptoms include none and have been stable. Weight trend: stable Prior visit with dietician: no Current diet: well balanced Current exercise: walking  Wt Readings from Last 3 Encounters:  09/05/16 178 lb (80.7 kg)  04/11/16 170 lb (77.1 kg)  03/30/16 173 lb (78.5 kg)         Allergies  Allergen Reactions  . Ivp Dye [Iodinated Diagnostic Agents] Rash    SEVERE  . Sulfa Antibiotics Rash     Current Outpatient Prescriptions:  .  aspirin EC 325 MG tablet, Take 325 mg by mouth every 6 (six) hours as needed., Disp: , Rfl:  .  fluticasone (FLONASE) 50 MCG/ACT nasal spray, Place 2 sprays into both nostrils daily., Disp: 16 g, Rfl: 6 .  Fluticasone-Salmeterol (ADVAIR DISKUS) 250-50 MCG/DOSE AEPB, Inhale 1 puff into the lungs 2 (two) times daily., Disp: 1 each, Rfl: 0 .  HYDROcodone-homatropine (HYCODAN) 5-1.5 MG/5ML syrup, 5 ml 4-6 hours as needed for cough, Disp: 240 mL, Rfl: 0 .  pravastatin (PRAVACHOL) 40 MG tablet, Take 1 tablet (40 mg total) by mouth daily., Disp: 30 tablet, Rfl: 3  Review of Systems  Constitutional: Negative.   Respiratory: Negative.   Cardiovascular: Negative.   Musculoskeletal: Negative.   Neurological: Negative.     Social History  Substance Use Topics  . Smoking status: Never Smoker  .  Smokeless tobacco: Never Used  . Alcohol use No   Objective:   BP 132/78 (BP Location: Right Arm, Patient Position: Sitting, Cuff Size: Normal)   Pulse 68   Temp 98.6 F (37 C)   Resp 16   Wt 178 lb (80.7 kg)   BMI 28.73 kg/m     Physical Exam   General Appearance:    Alert, cooperative, no distress  Eyes:    PERRL, conjunctiva/corneas clear, EOM's intact       Lungs:     Clear to auscultation bilaterally, respirations unlabored  Heart:    Regular rate and rhythm  Neurologic:   Awake, alert, oriented x 3. No apparent focal neurological           defect.           Assessment & Plan:     1. Hypercholesteremia She is tolerating pravastatin well with no adverse effects.   She is very resistant to continuing on statin, although she has no adverse effects. Discussed low saturated diet. She prefers to try OTC Red Yeast Rice extract. Will see how lipids look today.  - Comprehensive metabolic panel - Lipid panel  2. Adult BMI 28.0-28.9 kg/sq m Discussed diet and exercise.   The entirety of the information documented in the History of Present Illness, Review of Systems and Physical Exam were personally obtained by  me. Portions of this information were initially documented by Wilburt Finlay, CMA and reviewed by me for thoroughness and accuracy.        Lelon Huh, MD  [facility]

## 2016-09-06 ENCOUNTER — Other Ambulatory Visit: Payer: Self-pay

## 2016-09-06 LAB — LIPID PANEL
CHOL/HDL RATIO: 4.6 ratio — AB (ref 0.0–4.4)
Cholesterol, Total: 207 mg/dL — ABNORMAL HIGH (ref 100–199)
HDL: 45 mg/dL (ref >39)
LDL CALC: 122 mg/dL — AB (ref 0–99)
Triglycerides: 198 mg/dL — ABNORMAL HIGH (ref 0–149)
VLDL CHOLESTEROL CAL: 40 mg/dL (ref 5–40)

## 2016-09-06 LAB — COMPREHENSIVE METABOLIC PANEL
ALBUMIN: 4.2 g/dL (ref 3.6–4.8)
ALT: 13 IU/L (ref 0–32)
AST: 20 IU/L (ref 0–40)
Albumin/Globulin Ratio: 1.2 (ref 1.2–2.2)
Alkaline Phosphatase: 91 IU/L (ref 39–117)
BUN / CREAT RATIO: 18 (ref 12–28)
BUN: 13 mg/dL (ref 8–27)
Bilirubin Total: 0.7 mg/dL (ref 0.0–1.2)
CO2: 23 mmol/L (ref 18–29)
CREATININE: 0.72 mg/dL (ref 0.57–1.00)
Calcium: 9.2 mg/dL (ref 8.7–10.3)
Chloride: 102 mmol/L (ref 96–106)
GFR, EST AFRICAN AMERICAN: 100 mL/min/{1.73_m2} (ref >59)
GFR, EST NON AFRICAN AMERICAN: 87 mL/min/{1.73_m2} (ref >59)
GLUCOSE: 87 mg/dL (ref 65–99)
Globulin, Total: 3.5 g/dL (ref 1.5–4.5)
Potassium: 4.5 mmol/L (ref 3.5–5.2)
SODIUM: 142 mmol/L (ref 134–144)
TOTAL PROTEIN: 7.7 g/dL (ref 6.0–8.5)

## 2016-09-06 MED ORDER — PRAVASTATIN SODIUM 40 MG PO TABS: 40.0000 mg | ORAL_TABLET | Freq: Every day | ORAL | 12 refills | Status: DC

## 2016-09-06 NOTE — Telephone Encounter (Signed)
-----   Message from Birdie Sons, MD sent at 09/06/2016  7:54 AM EST ----- LDL cholesterol is much better, down from 180 to 122. Ideally should be under 100. I would recommend staying on pravastatin, but if she prefers to try OTC Red yeast rice extract and low saturated fat diet she could try that. Either way should recheck cholesterol again in 3-4 months.

## 2016-09-06 NOTE — Telephone Encounter (Signed)
Pt advised. She would like to stay on Pravastatin.  Please send refills to [facility] in [place].   Thanks,   -Mickel Baas

## 2016-10-25 ENCOUNTER — Other Ambulatory Visit: Payer: Self-pay | Admitting: Dermatology

## 2017-03-09 ENCOUNTER — Ambulatory Visit (INDEPENDENT_AMBULATORY_CARE_PROVIDER_SITE_OTHER): Payer: Medicare Other

## 2017-03-09 DIAGNOSIS — Z23 Encounter for immunization: Secondary | ICD-10-CM | POA: Diagnosis not present

## 2017-08-23 ENCOUNTER — Other Ambulatory Visit: Payer: Self-pay | Admitting: Obstetrics and Gynecology

## 2017-08-23 DIAGNOSIS — N6310 Unspecified lump in the right breast, unspecified quadrant: Secondary | ICD-10-CM

## 2017-08-29 ENCOUNTER — Ambulatory Visit: Payer: TRICARE For Life (TFL) | Admitting: Family Medicine

## 2017-09-06 ENCOUNTER — Encounter: Payer: Self-pay | Admitting: Family Medicine

## 2017-09-12 ENCOUNTER — Ambulatory Visit
Admission: RE | Admit: 2017-09-12 | Discharge: 2017-09-12 | Disposition: A | Discharge status: Home / Self Care | Payer: Medicare Other | Source: Ambulatory Visit | Attending: Obstetrics and Gynecology | Admitting: Obstetrics and Gynecology

## 2017-09-12 DIAGNOSIS — N6310 Unspecified lump in the right breast, unspecified quadrant: Secondary | ICD-10-CM | POA: Insufficient documentation

## 2017-09-12 HISTORY — DX: Personal history of antineoplastic chemotherapy: Z92.21

## 2017-09-12 HISTORY — DX: Personal history of irradiation: Z92.3

## 2017-10-16 ENCOUNTER — Other Ambulatory Visit: Payer: Self-pay | Admitting: Surgery

## 2017-10-16 DIAGNOSIS — E042 Nontoxic multinodular goiter: Secondary | ICD-10-CM

## 2017-10-21 ENCOUNTER — Ambulatory Visit
Admission: RE | Admit: 2017-10-21 | Discharge: 2017-10-21 | Disposition: A | Discharge status: Home / Self Care | Payer: Medicare Other | Source: Ambulatory Visit | Attending: Surgery | Admitting: Surgery

## 2017-10-21 DIAGNOSIS — E042 Nontoxic multinodular goiter: Secondary | ICD-10-CM

## 2018-03-12 ENCOUNTER — Ambulatory Visit (INDEPENDENT_AMBULATORY_CARE_PROVIDER_SITE_OTHER): Payer: Medicare Other

## 2018-03-12 DIAGNOSIS — Z23 Encounter for immunization: Secondary | ICD-10-CM | POA: Diagnosis not present

## 2018-07-21 ENCOUNTER — Telehealth: Payer: Self-pay | Admitting: Family Medicine

## 2018-07-21 NOTE — Telephone Encounter (Signed)
I left a message asking the patient to call and schedule AWV-I with McKenzie. VDM (DD)

## 2018-08-04 ENCOUNTER — Other Ambulatory Visit: Payer: Self-pay | Admitting: Obstetrics and Gynecology

## 2018-08-04 DIAGNOSIS — Z1231 Encounter for screening mammogram for malignant neoplasm of breast: Secondary | ICD-10-CM

## 2018-09-12 ENCOUNTER — Telehealth: Payer: Self-pay | Admitting: Family Medicine

## 2018-09-12 NOTE — Telephone Encounter (Signed)
I called the patient to schedule AWV-I with McKenzie.  She didn't want to do it right now, but she agreed to a call back in June 2020. VDM (DD)

## 2018-09-16 ENCOUNTER — Ambulatory Visit
Admission: RE | Admit: 2018-09-16 | Discharge: 2018-09-16 | Disposition: A | Discharge status: Home / Self Care | Payer: Medicare Other | Source: Ambulatory Visit | Attending: Obstetrics and Gynecology | Admitting: Obstetrics and Gynecology

## 2018-09-16 ENCOUNTER — Other Ambulatory Visit: Payer: Self-pay

## 2018-09-16 DIAGNOSIS — Z1231 Encounter for screening mammogram for malignant neoplasm of breast: Secondary | ICD-10-CM | POA: Diagnosis not present

## 2018-09-17 LAB — LIPID PANEL
CHOLESTEROL: 257 — AB (ref 0–200)
HDL: 48 (ref 35–70)
LDL Cholesterol: 179
Triglycerides: 152 (ref 40–160)

## 2018-09-19 ENCOUNTER — Telehealth: Payer: Self-pay | Admitting: Family Medicine

## 2018-09-19 NOTE — Telephone Encounter (Signed)
Please advise patient we got results of labs from Dr. Radene Knee and her cholesterol is back up to 257. She needs to get strict with a lot saturated diet. Will need to recheck when she comes in for her check up. She is supposed to schedule a wellness visit with Alyson Ingles and a follow up o.v. in June.  Recommend she go ahead an schedule these appointments.

## 2018-09-19 NOTE — Telephone Encounter (Signed)
Spoke to pt and advised information from note.  Pt advised that her bp was also elevated.  I tried to get her to make appt but she wants to wait til things settle down from this coronovirus.  She will call back later.

## 2018-10-21 ENCOUNTER — Other Ambulatory Visit: Payer: Self-pay | Admitting: Surgery

## 2018-10-21 DIAGNOSIS — E042 Nontoxic multinodular goiter: Secondary | ICD-10-CM

## 2018-10-24 IMAGING — MG MM DIGITAL SCREENING BILAT W/ TOMO W/ CAD
9 of 16 series · 9 of 32 positions shown · non-contrast
Comparison: Previous exam(s).

CLINICAL DATA: Screening. History of treated left breast cancer,
status post lumpectomy with radiation and chemotherapy in 0666.

EXAM:
2D DIGITAL SCREENING BILATERAL MAMMOGRAM WITH CAD AND ADJUNCT TOMO

[R MLO (1 of 2)]
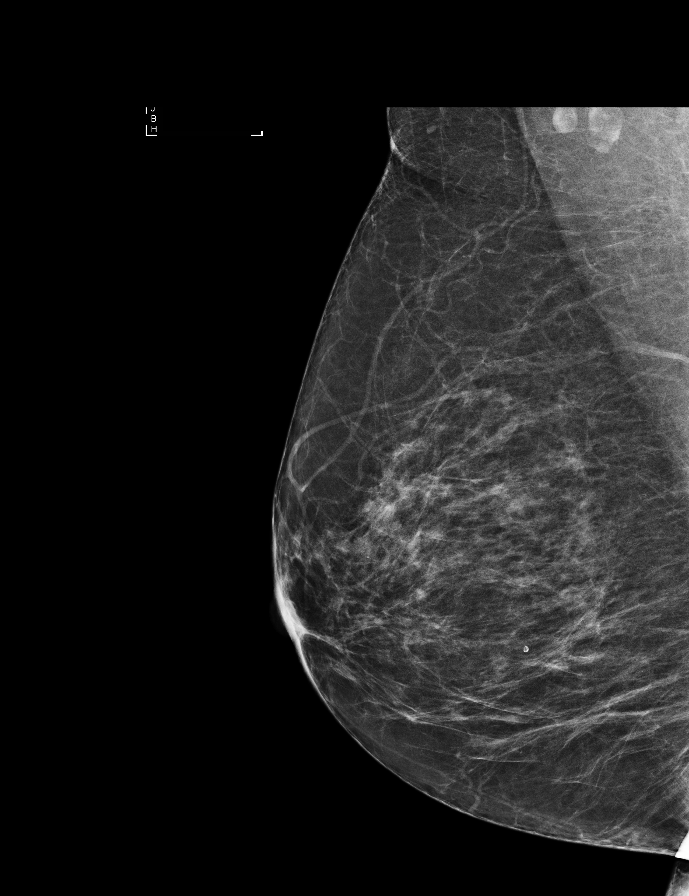

[R CC (1 of 2)]
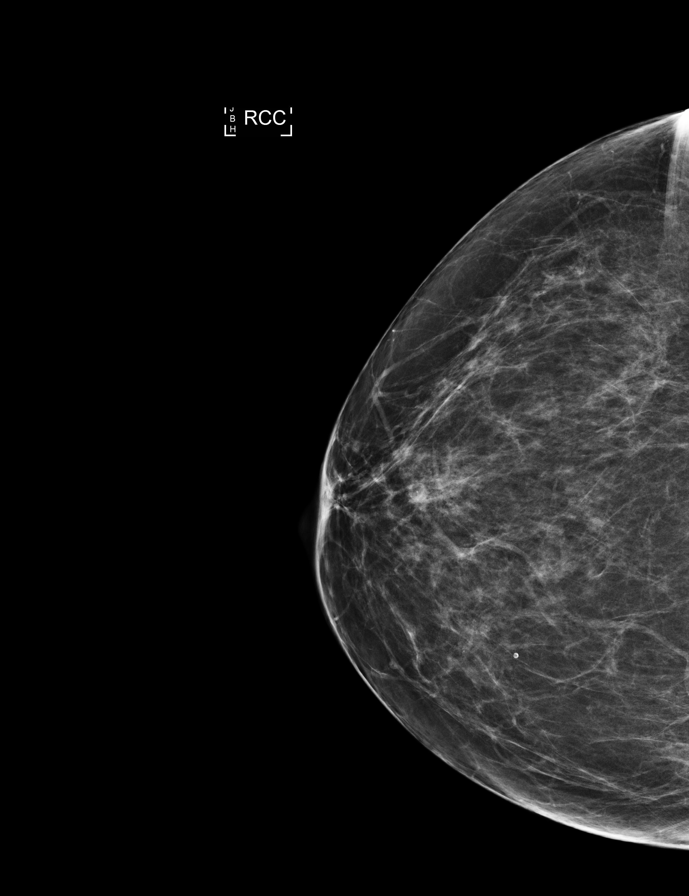

[L MLO synth-2D]
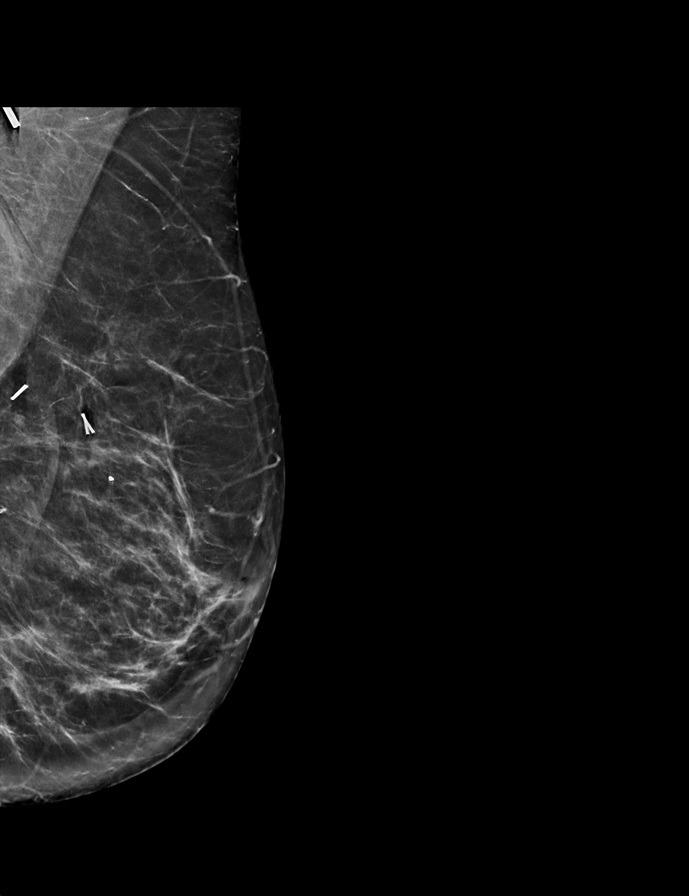

[R MLO synth-2D]
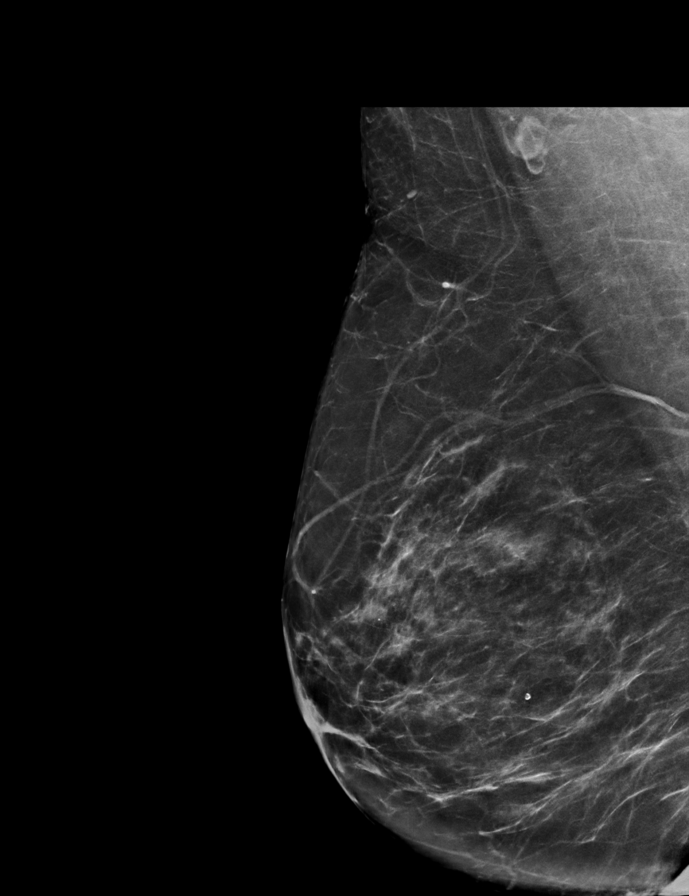

[L CC]
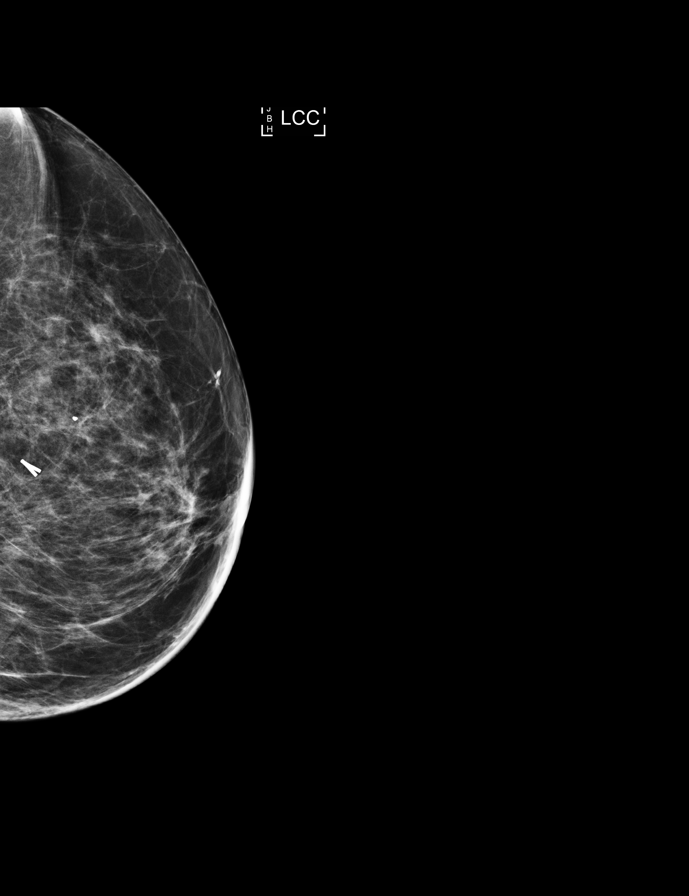

[R MLO (2 of 2)]
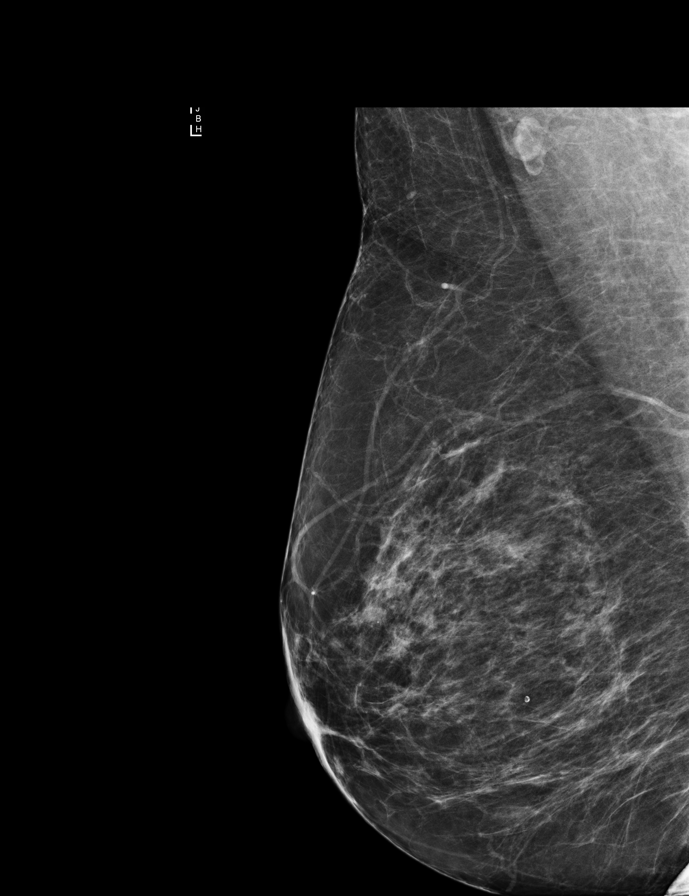

[L MLO]
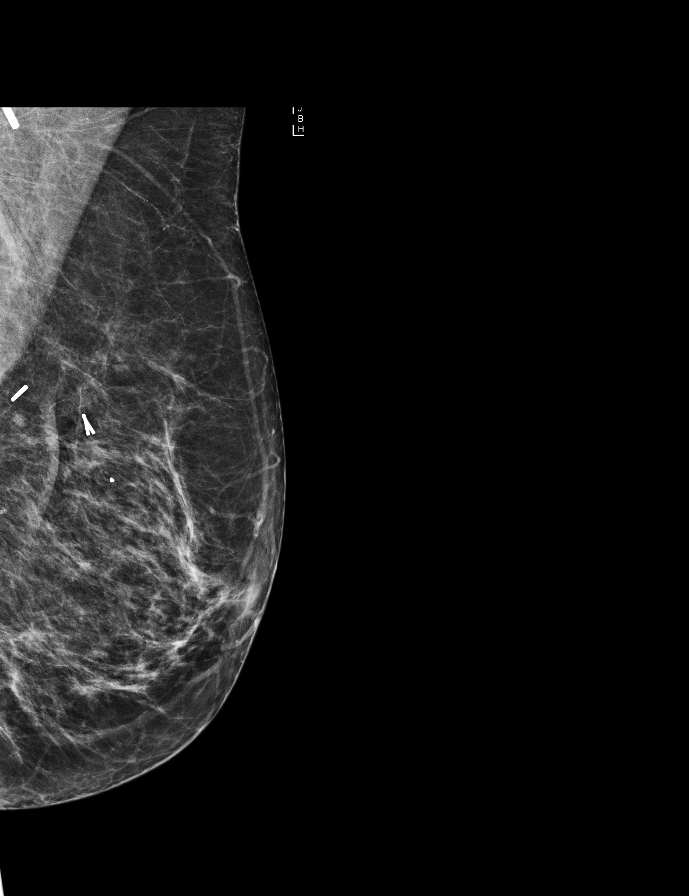

[R CC (2 of 2)]
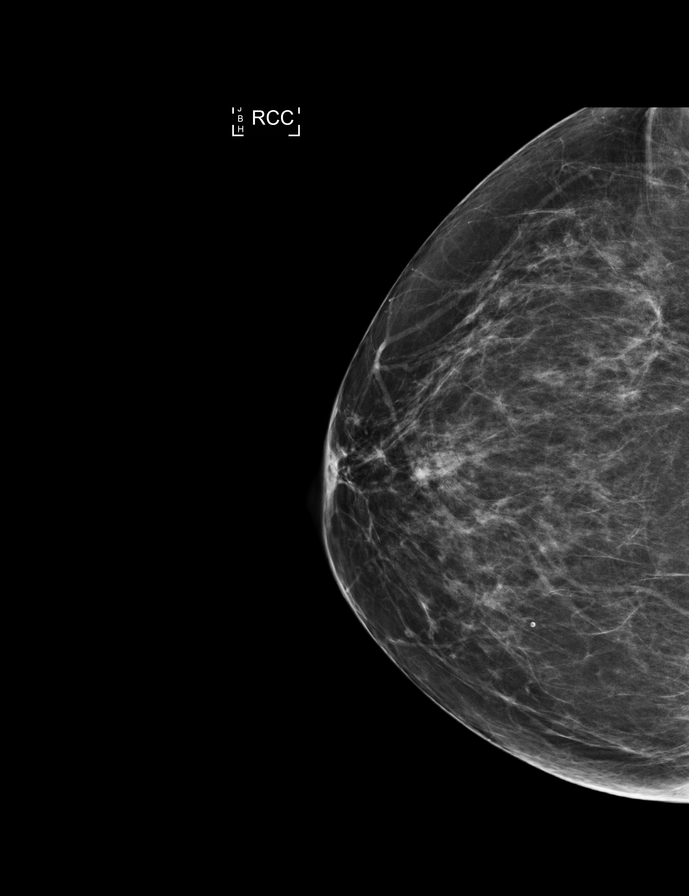

[L CC synth-2D]
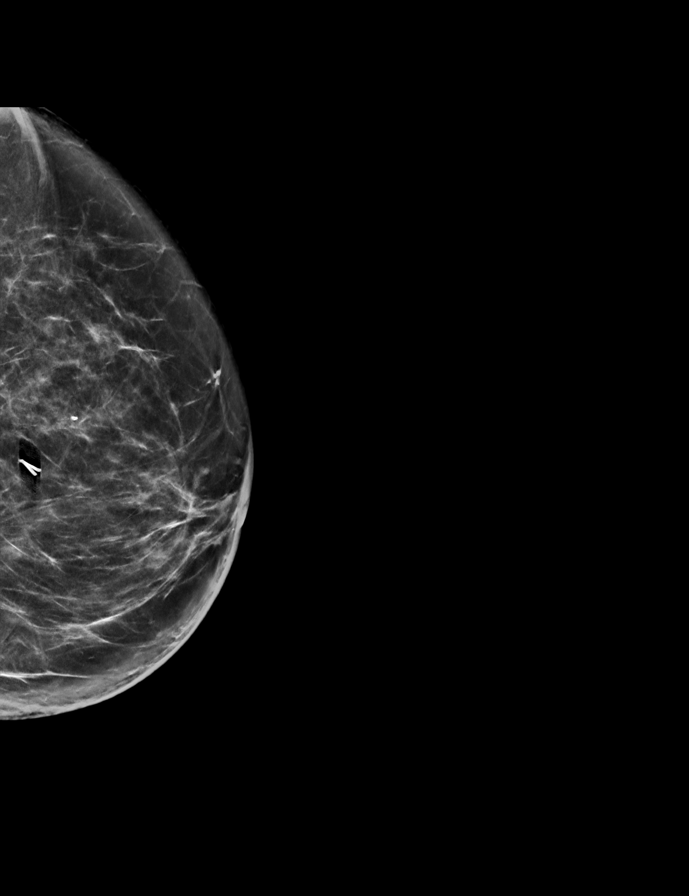

[9 of 32 positions shown; findings below may reference images not displayed]

ACR Breast Density Category b: There are scattered areas of
fibroglandular density.
FINDINGS: In the right breast, a possible asymmetry warrants further
evaluation. In the left breast, no findings suspicious for
malignancy. Stable postsurgical and posttreatment changes of the
left breast. Images were processed with CAD.
IMPRESSION: Further evaluation is suggested for possible asymmetry in the right
breast.

RECOMMENDATION:
Diagnostic mammogram and possibly ultrasound of the right breast.
(Code:[id number])

The patient will be contacted regarding the findings, and additional
imaging will be scheduled.

BI-RADS CATEGORY  0: Incomplete. Need additional imaging evaluation
and/or prior mammograms for comparison.

## 2018-10-27 ENCOUNTER — Ambulatory Visit
Admission: RE | Admit: 2018-10-27 | Discharge: 2018-10-27 | Disposition: A | Discharge status: Home / Self Care | Payer: Medicare Other | Source: Ambulatory Visit | Attending: Surgery | Admitting: Surgery

## 2018-10-27 ENCOUNTER — Other Ambulatory Visit: Payer: Self-pay

## 2018-10-27 DIAGNOSIS — E042 Nontoxic multinodular goiter: Secondary | ICD-10-CM

## 2019-03-18 ENCOUNTER — Ambulatory Visit (INDEPENDENT_AMBULATORY_CARE_PROVIDER_SITE_OTHER): Payer: Medicare Other

## 2019-03-18 ENCOUNTER — Other Ambulatory Visit: Payer: Self-pay

## 2019-03-18 DIAGNOSIS — Z23 Encounter for immunization: Secondary | ICD-10-CM | POA: Diagnosis not present

## 2019-04-19 LAB — FECAL OCCULT BLOOD, IMMUNOCHEMICAL: Fecal Occult Bld: POSITIVE

## 2019-05-08 ENCOUNTER — Other Ambulatory Visit: Payer: Self-pay | Admitting: Family Medicine

## 2019-05-08 DIAGNOSIS — Z860101 Personal history of adenomatous and serrated colon polyps: Secondary | ICD-10-CM | POA: Insufficient documentation

## 2019-05-08 DIAGNOSIS — R195 Other fecal abnormalities: Secondary | ICD-10-CM

## 2019-05-08 DIAGNOSIS — Z1211 Encounter for screening for malignant neoplasm of colon: Secondary | ICD-10-CM

## 2019-05-08 DIAGNOSIS — Z86010 Personal history of colonic polyps: Secondary | ICD-10-CM | POA: Insufficient documentation

## 2019-05-11 ENCOUNTER — Other Ambulatory Visit: Payer: Self-pay

## 2019-05-11 ENCOUNTER — Telehealth: Payer: Self-pay

## 2019-05-11 DIAGNOSIS — Z86010 Personal history of colonic polyps: Secondary | ICD-10-CM

## 2019-05-11 DIAGNOSIS — R195 Other fecal abnormalities: Secondary | ICD-10-CM

## 2019-05-11 DIAGNOSIS — Z800 Family history of malignant neoplasm of digestive organs: Secondary | ICD-10-CM

## 2019-05-11 NOTE — Telephone Encounter (Signed)
Gastroenterology Pre-Procedure Review  Request Date: 06/02/19 Tues Requesting Physician: Dr. Allen Norris  PATIENT REVIEW QUESTIONS: The patient responded to the following health history questions as indicated:    1. Are you having any GI issues? Pt states no. Referral came for Positive Fecal Occult 2. Do you have a personal history of Polyps? yes (doesn't recall the date of last colonoscopy, states polyps were found) 3. Do you have a family history of Colon Cancer or Polyps? yes (Maternal Aunt had colon cancer) 4. Diabetes Mellitus? no 5. Joint replacements in the past 12 months?no 6. Major health problems in the past 3 months?no 7. Any artificial heart valves, MVP, or defibrillator?no    MEDICATIONS & ALLERGIES:    Patient reports the following regarding taking any anticoagulation/antiplatelet therapy:   Plavix, Coumadin, Eliquis, Xarelto, Lovenox, Pradaxa, Brilinta, or Effient? no Aspirin? Pt denies taking Aspirin 325mg .  States she does not take any.  Patient confirms/reports the following medications:  Current Outpatient Medications  Medication Sig Dispense Refill  . aspirin EC 325 MG tablet Take 325 mg by mouth every 6 (six) hours as needed.    . fluticasone (FLONASE) 50 MCG/ACT nasal spray Place 2 sprays into both nostrils daily. 16 g 6  . Fluticasone-Salmeterol (ADVAIR DISKUS) 250-50 MCG/DOSE AEPB Inhale 1 puff into the lungs 2 (two) times daily. 1 each 0  . pravastatin (PRAVACHOL) 40 MG tablet Take 1 tablet (40 mg total) by mouth daily. 30 tablet 12   No current facility-administered medications for this visit.     Patient confirms/reports the following allergies:  Allergies  Allergen Reactions  . Ivp Dye [Iodinated Diagnostic Agents] Rash    SEVERE  . Sulfa Antibiotics Rash    No orders of the defined types were placed in this encounter.   AUTHORIZATION INFORMATION Primary Insurance: 1D#: Group #:  Secondary Insurance: 1D#: Group #:  SCHEDULE INFORMATION: Date:  Tuesday 06/02/19 Time: Location:[facility]

## 2019-05-29 ENCOUNTER — Other Ambulatory Visit: Payer: Self-pay

## 2019-05-29 ENCOUNTER — Other Ambulatory Visit
Admission: RE | Admit: 2019-05-29 | Discharge: 2019-05-29 | Disposition: A | Discharge status: Home / Self Care | Payer: Medicare Other | Source: Ambulatory Visit | Attending: Gastroenterology | Admitting: Gastroenterology

## 2019-05-29 DIAGNOSIS — Z01812 Encounter for preprocedural laboratory examination: Secondary | ICD-10-CM | POA: Insufficient documentation

## 2019-05-29 DIAGNOSIS — Z20828 Contact with and (suspected) exposure to other viral communicable diseases: Secondary | ICD-10-CM | POA: Diagnosis not present

## 2019-05-29 LAB — SARS CORONAVIRUS 2 (TAT 6-24 HRS): SARS Coronavirus 2: NEGATIVE

## 2019-06-02 ENCOUNTER — Encounter
Admission: RE | Disposition: A | Discharge status: Home / Self Care | Payer: Self-pay | Source: Home / Self Care | Attending: Gastroenterology

## 2019-06-02 ENCOUNTER — Ambulatory Visit: Payer: Medicare Other | Admitting: Certified Registered"

## 2019-06-02 ENCOUNTER — Other Ambulatory Visit: Payer: Self-pay

## 2019-06-02 ENCOUNTER — Ambulatory Visit
Admission: RE | Admit: 2019-06-02 | Discharge: 2019-06-02 | Disposition: A | Discharge status: Home / Self Care | Payer: Medicare Other | Attending: Gastroenterology | Admitting: Gastroenterology

## 2019-06-02 DIAGNOSIS — K621 Rectal polyp: Secondary | ICD-10-CM | POA: Diagnosis not present

## 2019-06-02 DIAGNOSIS — Z9221 Personal history of antineoplastic chemotherapy: Secondary | ICD-10-CM | POA: Insufficient documentation

## 2019-06-02 DIAGNOSIS — Z853 Personal history of malignant neoplasm of breast: Secondary | ICD-10-CM | POA: Insufficient documentation

## 2019-06-02 DIAGNOSIS — Z923 Personal history of irradiation: Secondary | ICD-10-CM | POA: Insufficient documentation

## 2019-06-02 DIAGNOSIS — Z91041 Radiographic dye allergy status: Secondary | ICD-10-CM | POA: Diagnosis not present

## 2019-06-02 DIAGNOSIS — Z86010 Personal history of colonic polyps: Secondary | ICD-10-CM | POA: Diagnosis not present

## 2019-06-02 DIAGNOSIS — Z79899 Other long term (current) drug therapy: Secondary | ICD-10-CM | POA: Diagnosis not present

## 2019-06-02 DIAGNOSIS — Z882 Allergy status to sulfonamides status: Secondary | ICD-10-CM | POA: Insufficient documentation

## 2019-06-02 DIAGNOSIS — Z1211 Encounter for screening for malignant neoplasm of colon: Secondary | ICD-10-CM | POA: Insufficient documentation

## 2019-06-02 DIAGNOSIS — K635 Polyp of colon: Secondary | ICD-10-CM | POA: Diagnosis not present

## 2019-06-02 DIAGNOSIS — K5730 Diverticulosis of large intestine without perforation or abscess without bleeding: Secondary | ICD-10-CM | POA: Diagnosis not present

## 2019-06-02 DIAGNOSIS — Z800 Family history of malignant neoplasm of digestive organs: Secondary | ICD-10-CM

## 2019-06-02 DIAGNOSIS — D128 Benign neoplasm of rectum: Secondary | ICD-10-CM | POA: Insufficient documentation

## 2019-06-02 DIAGNOSIS — Z7951 Long term (current) use of inhaled steroids: Secondary | ICD-10-CM | POA: Insufficient documentation

## 2019-06-02 DIAGNOSIS — R195 Other fecal abnormalities: Secondary | ICD-10-CM

## 2019-06-02 DIAGNOSIS — K640 First degree hemorrhoids: Secondary | ICD-10-CM | POA: Insufficient documentation

## 2019-06-02 HISTORY — PX: COLONOSCOPY WITH PROPOFOL: SHX5780

## 2019-06-02 SURGERY — COLONOSCOPY WITH PROPOFOL
Anesthesia: General

## 2019-06-02 MED ORDER — PROPOFOL 500 MG/50ML IV EMUL
INTRAVENOUS | Status: AC
  Filled 2019-06-02: qty 400

## 2019-06-02 MED ORDER — PROPOFOL 500 MG/50ML IV EMUL
INTRAVENOUS | Status: DC | PRN
  Administered 2019-06-02: 150 ug/kg/min via INTRAVENOUS

## 2019-06-02 MED ORDER — PROPOFOL 500 MG/50ML IV EMUL
INTRAVENOUS | Status: AC
  Filled 2019-06-02: qty 150

## 2019-06-02 MED ORDER — PROPOFOL 10 MG/ML IV BOLUS
INTRAVENOUS | Status: AC
  Filled 2019-06-02: qty 20

## 2019-06-02 MED ORDER — PROPOFOL 10 MG/ML IV BOLUS
INTRAVENOUS | Status: DC | PRN
  Administered 2019-06-02: 50 mg via INTRAVENOUS
  Administered 2019-06-02 (×2): 10 mg via INTRAVENOUS

## 2019-06-02 MED ORDER — LIDOCAINE HCL (CARDIAC) PF 100 MG/5ML IV SOSY
PREFILLED_SYRINGE | INTRAVENOUS | Status: DC | PRN
  Administered 2019-06-02: 100 mg via INTRATRACHEAL

## 2019-06-02 MED ORDER — SODIUM CHLORIDE 0.9 % IV SOLN
INTRAVENOUS | Status: DC
  Administered 2019-06-02: 07:00:00 1000 mL via INTRAVENOUS
  Administered 2019-06-02: 08:00:00 via INTRAVENOUS
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
## 2019-06-02 NOTE — Transfer of Care (Signed)
Immediate Anesthesia Transfer of Care Note  Patient: Bianca Brown  Procedure(s) Performed: COLONOSCOPY WITH PROPOFOL (N/A )  Patient Location: Endoscopy Unit  Anesthesia Type:General  Level of Consciousness: drowsy, patient cooperative and responds to stimulation  Airway & Oxygen Therapy: Patient Spontanous Breathing and Patient connected to face mask oxygen  Post-op Assessment: Report given to RN and Post -op Vital signs reviewed and stable  Post vital signs: Reviewed and stable  Last Vitals:  Vitals Value Taken Time  BP 112/72 06/02/19 0823  Temp 36.4 C 06/02/19 0823  Pulse 71 06/02/19 0824  Resp 20 06/02/19 0824  SpO2 99 % 06/02/19 0824  Vitals shown include unvalidated device data.  Last Pain:  Vitals:   06/02/19 0823  TempSrc: Temporal  PainSc: 0-No pain         Complications: No apparent anesthesia complications

## 2019-06-02 NOTE — Anesthesia Postprocedure Evaluation (Signed)
Anesthesia Post Note  Patient: Bianca Brown  Procedure(s) Performed: COLONOSCOPY WITH PROPOFOL (N/A )  Patient location during evaluation: Endoscopy Anesthesia Type: General Level of consciousness: awake and alert and oriented Pain management: pain level controlled Vital Signs Assessment: post-procedure vital signs reviewed and stable Respiratory status: spontaneous breathing, nonlabored ventilation and respiratory function stable Cardiovascular status: blood pressure returned to baseline and stable Postop Assessment: no signs of nausea or vomiting Anesthetic complications: no     Last Vitals:  Vitals:   06/02/19 0833 06/02/19 0843  BP: 119/81 (!) 142/93  Pulse: 72 68  Resp: 12 20  Temp:    SpO2: 98% 98%    Last Pain:  Vitals:   06/02/19 0843  TempSrc:   PainSc: 0-No pain                 Shaquille Murdy

## 2019-06-02 NOTE — Anesthesia Post-op Follow-up Note (Signed)
Anesthesia QCDR form completed.        

## 2019-06-02 NOTE — Op Note (Signed)
[facility] Gastroenterology Patient Name: Bianca Brown Procedure Date: 06/02/2019 7:59 AM MRN: [id number] Account #: [id number] Date of Birth: 1951-05-12 Admit Type: Outpatient Age: 68 Room: [facility] ENDO ROOM 4 Gender: Female Note Status: Finalized Procedure:             Colonoscopy Indications:           High risk colon cancer surveillance: Personal history                         of colonic polyps Providers:             Lucilla Lame MD, MD Referring MD:          Kirstie Peri. Caryn Section, MD (Referring MD) Medicines:             Propofol per Anesthesia Complications:         No immediate complications. Procedure:             Pre-Anesthesia Assessment:                        - Prior to the procedure, a History and Physical was                         performed, and patient medications and allergies were                         reviewed. The patient's tolerance of previous                         anesthesia was also reviewed. The risks and benefits                         of the procedure and the sedation options and risks                         were discussed with the patient. All questions were                         answered, and informed consent was obtained. Prior                         Anticoagulants: The patient has taken no previous                         anticoagulant or antiplatelet agents. ASA Grade                         Assessment: II - A patient with mild systemic disease.                         After reviewing the risks and benefits, the patient                         was deemed in satisfactory condition to undergo the                         procedure.  After obtaining informed consent, the colonoscope was                         passed under direct vision. Throughout the procedure,                         the patient's blood pressure, pulse, and oxygen                         saturations were monitored continuously. The                 Colonoscope was introduced through the anus and                         advanced to the the cecum, identified by appendiceal                         orifice and ileocecal valve. The colonoscopy was                         performed without difficulty. The patient tolerated                         the procedure well. The quality of the bowel                         preparation was excellent. Findings:      The perianal and digital rectal examinations were normal.      A 2 mm polyp was found in the sigmoid colon. The polyp was sessile. The       polyp was removed with a cold biopsy forceps. Resection and retrieval       were complete.      A 6 mm polyp was found in the rectum. The polyp was sessile. The polyp       was removed with a cold biopsy forceps. Resection and retrieval were       complete.      Multiple small-mouthed diverticula were found in the sigmoid colon.      Non-bleeding internal hemorrhoids were found during retroflexion. The       hemorrhoids were Grade I (internal hemorrhoids that do not prolapse). Impression:            - One 2 mm polyp in the sigmoid colon, removed with a                         cold biopsy forceps. Resected and retrieved.                        - One 6 mm polyp in the rectum, removed with a cold                         biopsy forceps. Resected and retrieved.                        - Diverticulosis in the sigmoid colon.                        - Non-bleeding internal hemorrhoids. Recommendation:        -  Discharge patient to home.                        - Resume previous diet.                        - Continue present medications.                        - Await pathology results.                        - Repeat colonoscopy in 5 years for surveillance. Procedure Code(s):     --- Professional ---                        [phone], Colonoscopy, flexible; with biopsy, single or                         multiple Diagnosis Code(s):     ---  Professional ---                        Z86.010, Personal history of colonic polyps                        K63.5, Polyp of colon                        K62.1, Rectal polyp CPT copyright 2019 [employer]. All rights reserved. The codes documented in this report are preliminary and upon coder review may  be revised to meet current compliance requirements. Lucilla Lame MD, MD 06/02/2019 8:21:39 AM This report has been signed electronically. Number of Addenda: 0 Note Initiated On: 06/02/2019 7:59 AM Scope Withdrawal Time: 0 hours 9 minutes 39 seconds  Total Procedure Duration: 0 hours 14 minutes 47 seconds  Estimated Blood Loss:  Estimated blood loss: none.      [facility]

## 2019-06-03 ENCOUNTER — Encounter: Payer: Self-pay | Admitting: Gastroenterology

## 2019-06-03 LAB — SURGICAL PATHOLOGY

## 2019-06-04 ENCOUNTER — Encounter: Payer: Self-pay | Admitting: Gastroenterology

## 2019-10-07 ENCOUNTER — Other Ambulatory Visit: Payer: Self-pay | Admitting: Surgery

## 2019-10-07 DIAGNOSIS — E042 Nontoxic multinodular goiter: Secondary | ICD-10-CM

## 2019-10-07 DIAGNOSIS — E049 Nontoxic goiter, unspecified: Secondary | ICD-10-CM

## 2019-10-13 ENCOUNTER — Other Ambulatory Visit: Payer: Medicare Other

## 2019-11-12 ENCOUNTER — Ambulatory Visit
Admission: RE | Admit: 2019-11-12 | Discharge: 2019-11-12 | Disposition: A | Discharge status: Home / Self Care | Payer: TRICARE For Life (TFL) | Source: Ambulatory Visit | Attending: Surgery | Admitting: Surgery

## 2019-11-12 DIAGNOSIS — E042 Nontoxic multinodular goiter: Secondary | ICD-10-CM

## 2019-11-12 DIAGNOSIS — E049 Nontoxic goiter, unspecified: Secondary | ICD-10-CM

## 2019-12-02 DIAGNOSIS — E042 Nontoxic multinodular goiter: Secondary | ICD-10-CM | POA: Diagnosis not present

## 2019-12-02 DIAGNOSIS — E049 Nontoxic goiter, unspecified: Secondary | ICD-10-CM | POA: Diagnosis not present
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
## 2019-12-15 ENCOUNTER — Encounter: Payer: Self-pay | Admitting: Family Medicine

## 2019-12-15 ENCOUNTER — Other Ambulatory Visit: Payer: Self-pay

## 2019-12-15 ENCOUNTER — Ambulatory Visit (INDEPENDENT_AMBULATORY_CARE_PROVIDER_SITE_OTHER): Payer: Medicare PPO

## 2019-12-15 ENCOUNTER — Ambulatory Visit (INDEPENDENT_AMBULATORY_CARE_PROVIDER_SITE_OTHER): Payer: Medicare PPO | Admitting: Family Medicine

## 2019-12-15 VITALS — BP 132/80 | HR 68 | Temp 97.3°F | Ht 66.0 in | Wt 183.0 lb

## 2019-12-15 DIAGNOSIS — E7800 Pure hypercholesterolemia, unspecified: Secondary | ICD-10-CM | POA: Diagnosis not present

## 2019-12-15 DIAGNOSIS — Z0000 Encounter for general adult medical examination without abnormal findings: Secondary | ICD-10-CM

## 2019-12-15 NOTE — Patient Instructions (Addendum)
The CDC recommends two doses of Shingrix (the shingles vaccine) separated by 2 to 6 months for adults age 71 years and older. I recommend checking with your pharmacy plan regarding coverage for this vaccine.    You are overdue for a bone density test. You can call our office at [phone] to have this scheduled at your convenience     Preventive Care 65 Years and Older, Female Preventive care refers to lifestyle choices and visits with your health care provider that can promote health and wellness. This includes:  A yearly physical exam. This is also called an annual well check.  Regular dental and eye exams.  Immunizations.  Screening for certain conditions.  Healthy lifestyle choices, such as diet and exercise. What can I expect for my preventive care visit? Physical exam Your health care provider will check:  Height and weight. These may be used to calculate body mass index (BMI), which is a measurement that tells if you are at a healthy weight.  Heart rate and blood pressure.  Your skin for abnormal spots. Counseling Your health care provider may ask you questions about:  Alcohol, tobacco, and drug use.  Emotional well-being.  Home and relationship well-being.  Sexual activity.  Eating habits.  History of falls.  Memory and ability to understand (cognition).  Work and work Statistician.  Pregnancy and menstrual history. What immunizations do I need?  Influenza (flu) vaccine  This is recommended every year. Tetanus, diphtheria, and pertussis (Tdap) vaccine  You may need a Td booster every 10 years. Varicella (chickenpox) vaccine  You may need this vaccine if you have not already been vaccinated. Zoster (shingles) vaccine  You may need this after age 21. Pneumococcal conjugate (PCV13) vaccine  One dose is recommended after age 44. Pneumococcal polysaccharide (PPSV23) vaccine  One dose is recommended after age 91. Measles, mumps, and rubella  (MMR) vaccine  You may need at least one dose of MMR if you were born in 1957 or later. You may also need a second dose. Meningococcal conjugate (MenACWY) vaccine  You may need this if you have certain conditions. Hepatitis A vaccine  You may need this if you have certain conditions or if you travel or work in places where you may be exposed to hepatitis A. Hepatitis B vaccine  You may need this if you have certain conditions or if you travel or work in places where you may be exposed to hepatitis B. Haemophilus influenzae type b (Hib) vaccine  You may need this if you have certain conditions. You may receive vaccines as individual doses or as more than one vaccine together in one shot (combination vaccines). Talk with your health care provider about the risks and benefits of combination vaccines. What tests do I need? Blood tests  Lipid and cholesterol levels. These may be checked every 5 years, or more frequently depending on your overall health.  Hepatitis C test.  Hepatitis B test. Screening  Lung cancer screening. You may have this screening every year starting at age 63 if you have a 30-pack-year history of smoking and currently smoke or have quit within the past 15 years.  Colorectal cancer screening. All adults should have this screening starting at age 61 and continuing until age 69. Your health care provider may recommend screening at age 60 if you are at increased risk. You will have tests every 1-10 years, depending on your results and the type of screening test.  Diabetes screening. This is done by  checking your blood sugar (glucose) after you have not eaten for a while (fasting). You may have this done every 1-3 years.  Mammogram. This may be done every 1-2 years. Talk with your health care provider about how often you should have regular mammograms.  BRCA-related cancer screening. This may be done if you have a family history of breast, ovarian, tubal, or peritoneal  cancers. Other tests  Sexually transmitted disease (STD) testing.  Bone density scan. This is done to screen for osteoporosis. You may have this done starting at age 48. Follow these instructions at home: Eating and drinking  Eat a diet that includes fresh fruits and vegetables, whole grains, lean protein, and low-fat dairy products. Limit your intake of foods with high amounts of sugar, saturated fats, and salt.  Take vitamin and mineral supplements as recommended by your health care provider.  Do not drink alcohol if your health care provider tells you not to drink.  If you drink alcohol: ? Limit how much you have to 0-1 drink a day. ? Be aware of how much alcohol is in your drink. In the [place] one drink equals one 12 oz bottle of beer (355 mL), one 5 oz glass of wine (148 mL), or one 1 oz glass of hard liquor (44 mL). Lifestyle  Take daily care of your teeth and gums.  Stay active. Exercise for at least 30 minutes on 5 or more days each week.  Do not use any products that contain nicotine or tobacco, such as cigarettes, e-cigarettes, and chewing tobacco. If you need help quitting, ask your health care provider.  If you are sexually active, practice safe sex. Use a condom or other form of protection in order to prevent STIs (sexually transmitted infections).  Talk with your health care provider about taking a low-dose aspirin or statin. What's next?  Go to your health care provider once a year for a well check visit.  Ask your health care provider how often you should have your eyes and teeth checked.  Stay up to date on all vaccines. This information is not intended to replace advice given to you by your health care provider. Make sure you discuss any questions you have with your health care provider. Document Revised: 06/12/2018 Document Reviewed: 06/12/2018 Elsevier Patient Education  2020 [employer].

## 2019-12-15 NOTE — Patient Instructions (Signed)
Bianca Brown , Thank you for taking time to come for your Medicare Wellness Visit. I appreciate your ongoing commitment to your health goals. Please review the following plan we discussed and let me know if I can assist you in the future.   Screening recommendations/referrals: Colonoscopy: Up to date, due 06/2024 Mammogram: Up to date, due 08/2020 Bone Density: Currently due. Declined order at this time.  Recommended yearly ophthalmology/optometry visit for glaucoma screening and checkup Recommended yearly dental visit for hygiene and checkup  Vaccinations: Influenza vaccine: Up to date Pneumococcal vaccine: Pneumovax 23 due. Declined vaccine today.  Tdap vaccine: Up to date, due 10/2020 Shingles vaccine: Pt declines today.     Advanced directives: Advance directive discussed with you today. I have provided a copy for you to complete at home and have notarized. Once this is complete please bring a copy in to our office so we can scan it into your chart.  Conditions/risks identified: Recommend to drink at least 6-8 8oz glasses of water per day.  Next appointment: 9:00 AM today with Dr Caryn Section    Preventive Care 71 Years and Older, Female Preventive care refers to lifestyle choices and visits with your health care provider that can promote health and wellness. What does preventive care include?  A yearly physical exam. This is also called an annual well check.  Dental exams once or twice a year.  Routine eye exams. Ask your health care provider how often you should have your eyes checked.  Personal lifestyle choices, including:  Daily care of your teeth and gums.  Regular physical activity.  Eating a healthy diet.  Avoiding tobacco and drug use.  Limiting alcohol use.  Practicing safe sex.  Taking low-dose aspirin every day.  Taking vitamin and mineral supplements as recommended by your health care provider. What happens during an annual well check? The services and  screenings done by your health care provider during your annual well check will depend on your age, overall health, lifestyle risk factors, and family history of disease. Counseling  Your health care provider may ask you questions about your:  Alcohol use.  Tobacco use.  Drug use.  Emotional well-being.  Home and relationship well-being.  Sexual activity.  Eating habits.  History of falls.  Memory and ability to understand (cognition).  Work and work Statistician.  Reproductive health. Screening  You may have the following tests or measurements:  Height, weight, and BMI.  Blood pressure.  Lipid and cholesterol levels. These may be checked every 5 years, or more frequently if you are over 102 years old.  Skin check.  Lung cancer screening. You may have this screening every year starting at age 85 if you have a 30-pack-year history of smoking and currently smoke or have quit within the past 15 years.  Fecal occult blood test (FOBT) of the stool. You may have this test every year starting at age 86.  Flexible sigmoidoscopy or colonoscopy. You may have a sigmoidoscopy every 5 years or a colonoscopy every 10 years starting at age 90.  Hepatitis C blood test.  Hepatitis B blood test.  Sexually transmitted disease (STD) testing.  Diabetes screening. This is done by checking your blood sugar (glucose) after you have not eaten for a while (fasting). You may have this done every 1-3 years.  Bone density scan. This is done to screen for osteoporosis. You may have this done starting at age 33.  Mammogram. This may be done every 1-2 years. Talk to your  health care provider about how often you should have regular mammograms. Talk with your health care provider about your test results, treatment options, and if necessary, the need for more tests. Vaccines  Your health care provider may recommend certain vaccines, such as:  Influenza vaccine. This is recommended every  year.  Tetanus, diphtheria, and acellular pertussis (Tdap, Td) vaccine. You may need a Td booster every 10 years.  Zoster vaccine. You may need this after age 75.  Pneumococcal 13-valent conjugate (PCV13) vaccine. One dose is recommended after age 86.  Pneumococcal polysaccharide (PPSV23) vaccine. One dose is recommended after age 63. Talk to your health care provider about which screenings and vaccines you need and how often you need them. This information is not intended to replace advice given to you by your health care provider. Make sure you discuss any questions you have with your health care provider. Document Released: 07/15/2015 Document Revised: 03/07/2016 Document Reviewed: 04/19/2015 Elsevier Interactive Patient Education  2017 [place] Prevention in the Home Falls can cause injuries. They can happen to people of all ages. There are many things you can do to make your home safe and to help prevent falls. What can I do on the outside of my home?  Regularly fix the edges of walkways and driveways and fix any cracks.  Remove anything that might make you trip as you walk through a door, such as a raised step or threshold.  Trim any bushes or trees on the path to your home.  Use bright outdoor lighting.  Clear any walking paths of anything that might make someone trip, such as rocks or tools.  Regularly check to see if handrails are loose or broken. Make sure that both sides of any steps have handrails.  Any raised decks and porches should have guardrails on the edges.  Have any leaves, snow, or ice cleared regularly.  Use sand or salt on walking paths during winter.  Clean up any spills in your garage right away. This includes oil or grease spills. What can I do in the bathroom?  Use night lights.  Install grab bars by the toilet and in the tub and shower. Do not use towel bars as grab bars.  Use non-skid mats or decals in the tub or shower.  If you  need to sit down in the shower, use a plastic, non-slip stool.  Keep the floor dry. Clean up any water that spills on the floor as soon as it happens.  Remove soap buildup in the tub or shower regularly.  Attach bath mats securely with double-sided non-slip rug tape.  Do not have throw rugs and other things on the floor that can make you trip. What can I do in the bedroom?  Use night lights.  Make sure that you have a light by your bed that is easy to reach.  Do not use any sheets or blankets that are too big for your bed. They should not hang down onto the floor.  Have a firm chair that has side arms. You can use this for support while you get dressed.  Do not have throw rugs and other things on the floor that can make you trip. What can I do in the kitchen?  Clean up any spills right away.  Avoid walking on wet floors.  Keep items that you use a lot in easy-to-reach places.  If you need to reach something above you, use a strong step stool that has a  grab bar.  Keep electrical cords out of the way.  Do not use floor polish or wax that makes floors slippery. If you must use wax, use non-skid floor wax.  Do not have throw rugs and other things on the floor that can make you trip. What can I do with my stairs?  Do not leave any items on the stairs.  Make sure that there are handrails on both sides of the stairs and use them. Fix handrails that are broken or loose. Make sure that handrails are as long as the stairways.  Check any carpeting to make sure that it is firmly attached to the stairs. Fix any carpet that is loose or worn.  Avoid having throw rugs at the top or bottom of the stairs. If you do have throw rugs, attach them to the floor with carpet tape.  Make sure that you have a light switch at the top of the stairs and the bottom of the stairs. If you do not have them, ask someone to add them for you. What else can I do to help prevent falls?  Wear shoes  that:  Do not have high heels.  Have rubber bottoms.  Are comfortable and fit you well.  Are closed at the toe. Do not wear sandals.  If you use a stepladder:  Make sure that it is fully opened. Do not climb a closed stepladder.  Make sure that both sides of the stepladder are locked into place.  Ask someone to hold it for you, if possible.  Clearly mark and make sure that you can see:  Any grab bars or handrails.  First and last steps.  Where the edge of each step is.  Use tools that help you move around (mobility aids) if they are needed. These include:  Canes.  Walkers.  Scooters.  Crutches.  Turn on the lights when you go into a dark area. Replace any light bulbs as soon as they burn out.  Set up your furniture so you have a clear path. Avoid moving your furniture around.  If any of your floors are uneven, fix them.  If there are any pets around you, be aware of where they are.  Review your medicines with your doctor. Some medicines can make you feel dizzy. This can increase your chance of falling. Ask your doctor what other things that you can do to help prevent falls. This information is not intended to replace advice given to you by your health care provider. Make sure you discuss any questions you have with your health care provider. Document Released: 04/14/2009 Document Revised: 11/24/2015 Document Reviewed: 07/23/2014 Elsevier Interactive Patient Education  2017 [employer].
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
## 2019-12-15 NOTE — Assessment & Plan Note (Deleted)
Did not tolerate Pravastatin in the past.  Pt reports working on lifestyle such as walking 30 minutes a day.   Will recheck labs today.

## 2019-12-16 ENCOUNTER — Other Ambulatory Visit: Payer: Self-pay | Admitting: Family Medicine

## 2019-12-16 LAB — CBC WITH DIFFERENTIAL/PLATELET
Basophils Absolute: 0.1 10*3/uL (ref 0.0–0.2)
Basos: 1 %
EOS (ABSOLUTE): 0.1 10*3/uL (ref 0.0–0.4)
Eos: 1 %
Hematocrit: 45.1 % (ref 34.0–46.6)
Hemoglobin: 14.4 g/dL (ref 11.1–15.9)
Immature Grans (Abs): 0 10*3/uL (ref 0.0–0.1)
Immature Granulocytes: 0 %
Lymphocytes Absolute: 2.4 10*3/uL (ref 0.7–3.1)
Lymphs: 31 %
MCH: 27.7 pg (ref 26.6–33.0)
MCHC: 31.9 g/dL (ref 31.5–35.7)
MCV: 87 fL (ref 79–97)
Monocytes Absolute: 0.6 10*3/uL (ref 0.1–0.9)
Monocytes: 7 %
Neutrophils Absolute: 4.8 10*3/uL (ref 1.4–7.0)
Neutrophils: 60 %
Platelets: 322 10*3/uL (ref 150–450)
RBC: 5.2 x10E6/uL (ref 3.77–5.28)
RDW: 13 % (ref 11.7–15.4)
WBC: 8 10*3/uL (ref 3.4–10.8)

## 2019-12-16 LAB — COMPREHENSIVE METABOLIC PANEL
ALT: 15 IU/L (ref 0–32)
AST: 21 IU/L (ref 0–40)
Albumin/Globulin Ratio: 1.2 (ref 1.2–2.2)
Albumin: 4.1 g/dL (ref 3.8–4.8)
Alkaline Phosphatase: 112 IU/L (ref 48–121)
BUN/Creatinine Ratio: 14 (ref 12–28)
BUN: 11 mg/dL (ref 8–27)
Bilirubin Total: 1.1 mg/dL (ref 0.0–1.2)
CO2: 21 mmol/L (ref 20–29)
Calcium: 9.1 mg/dL (ref 8.7–10.3)
Chloride: 98 mmol/L (ref 96–106)
Creatinine, Ser: 0.8 mg/dL (ref 0.57–1.00)
GFR calc Af Amer: 86 mL/min/{1.73_m2} (ref >59)
GFR calc non Af Amer: 75 mL/min/{1.73_m2} (ref >59)
Globulin, Total: 3.4 g/dL (ref 1.5–4.5)
Glucose: 79 mg/dL (ref 65–99)
Potassium: 4 mmol/L (ref 3.5–5.2)
Sodium: 142 mmol/L (ref 134–144)
Total Protein: 7.5 g/dL (ref 6.0–8.5)

## 2019-12-16 LAB — LIPID PANEL
Chol/HDL Ratio: 5.8 ratio — ABNORMAL HIGH (ref 0.0–4.4)
Cholesterol, Total: 267 mg/dL — ABNORMAL HIGH (ref 100–199)
HDL: 46 mg/dL (ref >39)
LDL Chol Calc (NIH): 188 mg/dL — ABNORMAL HIGH (ref 0–99)
Triglycerides: 174 mg/dL — ABNORMAL HIGH (ref 0–149)
VLDL Cholesterol Cal: 33 mg/dL (ref 5–40)

## 2019-12-16 LAB — TSH: TSH: 0.747 u[IU]/mL (ref 0.450–4.500)

## 2019-12-16 MED ORDER — ATORVASTATIN CALCIUM 20 MG PO TABS: 20.0000 mg | ORAL_TABLET | Freq: Every day | ORAL | 3 refills | Status: DC

## 2020-01-05 DIAGNOSIS — H903 Sensorineural hearing loss, bilateral: Secondary | ICD-10-CM | POA: Diagnosis not present

## 2020-01-05 DIAGNOSIS — H93A1 Pulsatile tinnitus, right ear: Secondary | ICD-10-CM | POA: Diagnosis not present

## 2020-01-08 ENCOUNTER — Other Ambulatory Visit: Payer: Self-pay | Admitting: Physician Assistant

## 2020-01-08 DIAGNOSIS — H93A1 Pulsatile tinnitus, right ear: Secondary | ICD-10-CM

## 2020-01-22 ENCOUNTER — Ambulatory Visit: Payer: Medicare PPO

## 2020-02-07 ENCOUNTER — Other Ambulatory Visit: Payer: Self-pay | Admitting: Family Medicine

## 2020-02-07 NOTE — Telephone Encounter (Signed)
Requested Prescriptions  Pending Prescriptions Disp Refills  . atorvastatin (LIPITOR) 20 MG tablet [Pharmacy Med Name: ATORVASTATIN 20MG  TABLETS] 90 tablet 3    Sig: TAKE 1 TABLET(20 MG) BY MOUTH DAILY     Cardiovascular:  Antilipid - Statins Failed - 02/07/2020  6:31 PM      Failed - Total Cholesterol in normal range and within 360 days    Cholesterol, Total  Date Value Ref Range Status  12/15/2019 267 (H) 100 - 199 mg/dL Final         Failed - LDL in normal range and within 360 days    LDL Chol Calc (NIH)  Date Value Ref Range Status  12/15/2019 188 (H) 0 - 99 mg/dL Final         Failed - Triglycerides in normal range and within 360 days    Triglycerides  Date Value Ref Range Status  12/15/2019 174 (H) 0 - 149 mg/dL Final         Passed - HDL in normal range and within 360 days    HDL  Date Value Ref Range Status  12/15/2019 46 >39 mg/dL Final         Passed - Patient is not pregnant      Passed - Valid encounter within last 12 months    Recent Outpatient Visits          1 month ago Annual physical exam   [facility] Birdie Sons, MD   3 years ago [place], Donald E, MD   3 years ago Cough   [facility] Jerrol Banana., MD   3 years ago Eustachian tube dysfunction, bilateral   [place], Herbie [place], [place]      Future Appointments            In 10 months Caryn Section, Kirstie Peri, MD [facility]           '

## 2020-03-04 ENCOUNTER — Telehealth: Payer: Self-pay | Admitting: Family Medicine

## 2020-03-04 DIAGNOSIS — E7800 Pure hypercholesterolemia, unspecified: Secondary | ICD-10-CM

## 2020-03-04 NOTE — Telephone Encounter (Signed)
Please advise patient its time to recheck lipids since starting atorvastatin in June. Have place future order in chart. She can stop by lab any day next week when she is fasting.

## 2020-03-04 NOTE — Telephone Encounter (Signed)
Patient advised and agrees to have labs done next week. Lab slip placed up front at [address].

## 2020-03-09 DIAGNOSIS — E7800 Pure hypercholesterolemia, unspecified: Secondary | ICD-10-CM | POA: Diagnosis not present

## 2020-03-10 LAB — COMPREHENSIVE METABOLIC PANEL
ALT: 19 IU/L (ref 0–32)
AST: 20 IU/L (ref 0–40)
Albumin/Globulin Ratio: 1.3 (ref 1.2–2.2)
Albumin: 4 g/dL (ref 3.7–4.7)
Alkaline Phosphatase: 127 IU/L — ABNORMAL HIGH (ref 48–121)
BUN/Creatinine Ratio: 17 (ref 12–28)
BUN: 14 mg/dL (ref 8–27)
Bilirubin Total: 0.8 mg/dL (ref 0.0–1.2)
CO2: 19 mmol/L — ABNORMAL LOW (ref 20–29)
Calcium: 8.6 mg/dL — ABNORMAL LOW (ref 8.7–10.3)
Chloride: 103 mmol/L (ref 96–106)
Creatinine, Ser: 0.81 mg/dL (ref 0.57–1.00)
GFR calc Af Amer: 85 mL/min/{1.73_m2} (ref >59)
GFR calc non Af Amer: 73 mL/min/{1.73_m2} (ref >59)
Globulin, Total: 3.2 g/dL (ref 1.5–4.5)
Glucose: 91 mg/dL (ref 65–99)
Potassium: 4 mmol/L (ref 3.5–5.2)
Sodium: 140 mmol/L (ref 134–144)
Total Protein: 7.2 g/dL (ref 6.0–8.5)

## 2020-03-10 LAB — LIPID PANEL
Chol/HDL Ratio: 4.4 ratio (ref 0.0–4.4)
Cholesterol, Total: 161 mg/dL (ref 100–199)
HDL: 37 mg/dL — ABNORMAL LOW (ref >39)
LDL Chol Calc (NIH): 103 mg/dL — ABNORMAL HIGH (ref 0–99)
Triglycerides: 112 mg/dL (ref 0–149)
VLDL Cholesterol Cal: 21 mg/dL (ref 5–40)

## 2020-03-24 ENCOUNTER — Other Ambulatory Visit: Payer: Self-pay

## 2020-03-24 ENCOUNTER — Ambulatory Visit (INDEPENDENT_AMBULATORY_CARE_PROVIDER_SITE_OTHER): Payer: Medicare PPO

## 2020-03-24 DIAGNOSIS — Z23 Encounter for immunization: Secondary | ICD-10-CM

## 2020-09-19 DIAGNOSIS — H2513 Age-related nuclear cataract, bilateral: Secondary | ICD-10-CM | POA: Diagnosis not present

## 2020-09-19 DIAGNOSIS — Z0100 Encounter for examination of eyes and vision without abnormal findings: Secondary | ICD-10-CM | POA: Diagnosis not present

## 2020-12-19 ENCOUNTER — Ambulatory Visit (INDEPENDENT_AMBULATORY_CARE_PROVIDER_SITE_OTHER): Payer: Medicare PPO | Admitting: Family Medicine

## 2020-12-19 ENCOUNTER — Other Ambulatory Visit: Payer: Self-pay

## 2020-12-19 ENCOUNTER — Encounter: Payer: Self-pay | Admitting: Family Medicine

## 2020-12-19 ENCOUNTER — Ambulatory Visit: Payer: Medicare PPO

## 2020-12-19 VITALS — BP 135/83 | HR 75 | Ht 66.0 in | Wt 184.0 lb

## 2020-12-19 DIAGNOSIS — Z0000 Encounter for general adult medical examination without abnormal findings: Secondary | ICD-10-CM | POA: Diagnosis not present

## 2020-12-19 DIAGNOSIS — Z289 Immunization not carried out for unspecified reason: Secondary | ICD-10-CM | POA: Diagnosis not present

## 2020-12-19 DIAGNOSIS — E7800 Pure hypercholesterolemia, unspecified: Secondary | ICD-10-CM

## 2020-12-19 DIAGNOSIS — Z23 Encounter for immunization: Secondary | ICD-10-CM | POA: Diagnosis not present

## 2020-12-19 DIAGNOSIS — E2839 Other primary ovarian failure: Secondary | ICD-10-CM

## 2020-12-19 DIAGNOSIS — Z1231 Encounter for screening mammogram for malignant neoplasm of breast: Secondary | ICD-10-CM

## 2020-12-19 MED ORDER — TETANUS-DIPHTHERIA TOXOIDS TD 5-2 LFU IM INJ: 0.5000 mL | INJECTION | Freq: Once | INTRAMUSCULAR | 0 refills | Status: AC

## 2020-12-19 MED ORDER — SHINGRIX 50 MCG/0.5ML IM SUSR: 0.5000 mL | Freq: Once | INTRAMUSCULAR | 0 refills | Status: AC

## 2020-12-19 NOTE — Progress Notes (Signed)
Annual Wellness Visit     Patient: Bianca Brown, Female    DOB: 1951/02/10, 70 y.o.   MRN: [id number] Visit Date: 12/19/2020  Today's Provider: Lelon Huh, MD   Chief Complaint  Patient presents with   Annual Exam   Subjective    Bianca Brown is a 70 y.o. female who presents today for her Annual Wellness Visit.     Medications: Outpatient Medications Prior to Visit  Medication Sig Note   aspirin EC 325 MG tablet Take 325 mg by mouth every 6 (six) hours as needed.    fluticasone (FLONASE) 50 MCG/ACT nasal spray Place 2 sprays into both nostrils daily.    [DISCONTINUED] atorvastatin (LIPITOR) 20 MG tablet TAKE 1 TABLET(20 MG) BY MOUTH DAILY 12/19/2020: rash   No facility-administered medications prior to visit.    Allergies  Allergen Reactions   Pravastatin     Nausea, fatigue   Atorvastatin Rash   Ivp Dye [Iodinated Diagnostic Agents] Rash    SEVERE   Sulfa Antibiotics Rash    Patient Care Team: Bianca Sons, MD as PCP - General (Family Medicine)        Objective     Most recent functional status assessment: In your present state of health, do you have any difficulty performing the following activities: 12/19/2020  Hearing? N  Vision? N  Difficulty concentrating or making decisions? N  Walking or climbing stairs? N  Dressing or bathing? N  Doing errands, shopping? N  Some recent data might be hidden   Most recent fall risk assessment: Fall Risk  12/19/2020  Falls in the past year? 0  Comment -  Number falls in past yr: 0  Injury with Fall? 0  Risk for fall due to : No Fall Risks  Follow up Falls evaluation completed    Most recent depression screenings: PHQ 2/9 Scores 12/19/2020 12/15/2019  PHQ - 2 Score 0 0  PHQ- 9 Score 0 -   Most recent cognitive screening: 6CIT Screen 12/19/2020  What Year? 0 points  What month? 0 points  What time? 0 points  Count back from 20 0 points  Months in reverse 0 points  Repeat phrase 4 points   Total Score 4   Most recent Audit-C alcohol use screening Alcohol Use Disorder Test (AUDIT) 12/19/2020  1. How often do you have a drink containing alcohol? 0  2. How many drinks containing alcohol do you have on a typical day when you are drinking? 0  3. How often do you have six or more drinks on one occasion? 0  AUDIT-C Score 0  Alcohol Brief Interventions/Follow-up -   A score of 3 or more in women, and 4 or more in men indicates increased risk for alcohol abuse, EXCEPT if all of the points are from question 1   No results found for any visits on 12/19/20.  Assessment & Plan     Annual wellness visit done today including the all of the following: Reviewed patient's Family Medical History Reviewed and updated list of patient's medical providers Assessment of cognitive impairment was done Assessed patient's functional ability Established a written schedule for health screening [place] Completed and Reviewed  Exercise Activities and Dietary recommendations  Goals      DIET - INCREASE WATER INTAKE     Recommend to drink at least 6-8 8oz glasses of water per day.         Immunization History  Administered Date(s)  Administered   Fluad Quad(high Dose 65+) 03/18/2019, 03/24/2020   Influenza, High Dose Seasonal PF 04/21/2015, 05/03/2016, 03/09/2017, 03/12/2018   PFIZER Comirnaty(Gray Top)Covid-19 Tri-Sucrose Vaccine 12/19/2020   PFIZER(Purple Top)SARS-COV-2 Vaccination 08/11/2019, 09/01/2019   Pneumococcal Conjugate-13 04/21/2015   Pneumococcal Polysaccharide-23 04/01/2013   Tdap 11/15/2010   Zoster, Live 04/01/2012    Health Maintenance  Topic Date Due   Zoster Vaccines- Shingrix (1 of 2) Never done   DEXA SCAN  06/04/2011   PNA vac Low Risk Adult (2 of 2 - PPSV23) 04/01/2018   MAMMOGRAM  09/15/2020   TETANUS/TDAP  11/14/2020   INFLUENZA VACCINE  01/30/2021   COVID-19 Vaccine (4 - Booster for Pfizer series) 04/20/2021   COLONOSCOPY (Pts  45-56yrs Insurance coverage will need to be confirmed)  06/01/2024   Hepatitis C Screening  Completed   HPV VACCINES  Aged Out     Discussed health benefits of physical activity, and encouraged her to engage in regular exercise appropriate for her age and condition.      No follow-ups on file.     The entirety of the information documented in the History of Present Illness, Review of Systems and Physical Exam were personally obtained by me. Portions of this information were initially documented by the CMA and reviewed by me for thoroughness and accuracy.     Lelon Huh, MD  [facility] [phone] (phone) [phone] (fax)  [name]

## 2020-12-19 NOTE — Patient Instructions (Addendum)
You are due for a Tdap (tetanus-diptheria-pertussis vaccine) which protects you from tetanus and whooping cough. Please check with your insurance plan or pharmacy regarding coverage for this vaccine.    The CDC recommends two doses of Shingrix (the shingles vaccine) separated by 2 to 6 months for adults age 72 years and older. I recommend checking with your pharmacy plan regarding coverage for this vaccine.     Please call the [facility] at [phone] to schedule your mammogram.

## 2020-12-19 NOTE — Progress Notes (Signed)
Complete physical     Patient: Bianca Brown, Female    DOB: 15-Sep-1950, 70 y.o.   MRN: [id number] Visit Date: 12/19/2020  Today's Provider: Lelon Huh, MD   Chief Complaint  Patient presents with   Annual Exam   Subjective    Bianca Brown is a 70 y.o. female who presents today for her complete physical . She reports consuming a general diet.  Exercises regularly.  She generally feels well. She reports sleeping well. She does not have additional problems to discuss today.   HPI  She had been prescribed atorvastatin for hyperlipidemia but stopped a few months ago due to rash which resolved when medications was stopped, she was previously tried on pravastatin which was discontinued due to nausea and fatigue.     Medications: Outpatient Medications Prior to Visit  Medication Sig Note   aspirin EC 325 MG tablet Take 325 mg by mouth every 6 (six) hours as needed.    fluticasone (FLONASE) 50 MCG/ACT nasal spray Place 2 sprays into both nostrils daily.    [DISCONTINUED] atorvastatin (LIPITOR) 20 MG tablet TAKE 1 TABLET(20 MG) BY MOUTH DAILY 12/19/2020: rash   No facility-administered medications prior to visit.    Allergies  Allergen Reactions   Pravastatin     Nausea, fatigue   Atorvastatin Rash   Ivp Dye [Iodinated Diagnostic Agents] Rash    SEVERE   Sulfa Antibiotics Rash    Patient Care Team: Birdie Sons, MD as PCP - General (Family Medicine)  Review of Systems  Constitutional: Negative.   HENT: Negative.    Eyes: Negative.   Respiratory: Negative.    Cardiovascular: Negative.   Gastrointestinal: Negative.   Endocrine: Negative.   Genitourinary: Negative.   Musculoskeletal: Negative.   Skin: Negative.   Allergic/Immunologic: Negative.   Neurological: Negative.   Hematological: Negative.   Psychiatric/Behavioral: Negative.         Objective    Vitals: BP 135/83 (BP Location: Right Arm, Patient Position: Sitting, Cuff Size: Large)   Pulse  75   Ht 5\' 6"  (1.676 m)   Wt 184 lb (83.5 kg)   SpO2 98%   BMI 29.70 kg/m    Physical Exam  General Appearance:     Overweight female. Alert, cooperative, in no acute distress, appears stated age   Head:    Normocephalic, without obvious abnormality, atraumatic  Eyes:    PERRL, conjunctiva/corneas clear, EOM's intact, fundi    benign, both eyes  Ears:    Normal TM's and external ear canals, both ears  Neck:   Supple, symmetrical, trachea midline, no adenopathy;    thyroid:  no enlargement/tenderness/nodules; no carotid   bruit or JVD  Back:     Symmetric, no curvature, ROM normal, no CVA tenderness  Lungs:     Clear to auscultation bilaterally, respirations unlabored  Chest Wall:    No tenderness or deformity   Heart:    Normal heart rate. Normal rhythm. No murmurs, rubs, or gallops.    Breast Exam:    deferred  Abdomen:     Soft, non-tender, bowel sounds active all four quadrants,    no masses, no organomegaly  Pelvic:    deferred  Extremities:   All extremities are intact. No cyanosis or edema  Pulses:   2+ and symmetric all extremities  Skin:   Skin color, texture, turgor normal, no rashes or lesions  Lymph nodes:   Cervical, supraclavicular, and axillary nodes normal  Neurologic:  CNII-XII intact, normal strength, sensation and reflexes    throughout       Assessment & Plan      1. Annual physical exam   2. Breast cancer screening by mammogram  - MM 3D Screening Breast Bilateral - [place]; Future  3. Estrogen deficiency  - DG Bone density [place]; Future  4. Prescription for Shingrix. Vaccine not administered in office.   - Zoster Vaccine Adjuvanted [facility]) injection; Inject 0.5 mLs into the muscle once for 1 dose. Repeat after 2 months  Dispense: 0.5 mL; Refill: 0  5. Prescription for Td. Vaccine not administered in office.   - tetanus & diphtheria toxoids, adult, (TENIVAC) 5-2 LFU injection; Inject 0.5 mLs into the muscle once for 1  dose.  Dispense: 0.5 mL; Refill: 0  6. Need for vaccination against Streptococcus pneumoniae She declined today since she is getting Covid booster  7. Hypercholesteremia Intolerant to pravastatin and stopped atorvastatin due to rash. Discussed trial of ezetimibe if lipids still elevated.   - CBC - Comprehensive metabolic panel - Lipid panel - TSH  8. Need for COVID-19 vaccine  - PFIZER Comirnaty(GRAY TOP)COVID-19 Vaccine        The entirety of the information documented in the History of Present Illness, Review of Systems and Physical Exam were personally obtained by me. Portions of this information were initially documented by the CMA and reviewed by me for thoroughness and accuracy.     Lelon Huh, MD  [facility] [phone] (phone) [phone] (fax)  [place]

## 2020-12-20 ENCOUNTER — Other Ambulatory Visit: Payer: Self-pay | Admitting: Family Medicine

## 2020-12-20 DIAGNOSIS — E7800 Pure hypercholesterolemia, unspecified: Secondary | ICD-10-CM

## 2020-12-20 LAB — COMPREHENSIVE METABOLIC PANEL
ALT: 15 IU/L (ref 0–32)
AST: 13 IU/L (ref 0–40)
Albumin/Globulin Ratio: 1.4 (ref 1.2–2.2)
Albumin: 4.2 g/dL (ref 3.7–4.7)
Alkaline Phosphatase: 109 IU/L (ref 44–121)
BUN/Creatinine Ratio: 17 (ref 12–28)
BUN: 15 mg/dL (ref 8–27)
Bilirubin Total: 0.8 mg/dL (ref 0.0–1.2)
CO2: 24 mmol/L (ref 20–29)
Calcium: 9.3 mg/dL (ref 8.7–10.3)
Chloride: 100 mmol/L (ref 96–106)
Creatinine, Ser: 0.89 mg/dL (ref 0.57–1.00)
Globulin, Total: 3.1 g/dL (ref 1.5–4.5)
Glucose: 90 mg/dL (ref 65–99)
Potassium: 4.1 mmol/L (ref 3.5–5.2)
Sodium: 139 mmol/L (ref 134–144)
Total Protein: 7.3 g/dL (ref 6.0–8.5)
eGFR: 69 mL/min/{1.73_m2} (ref >59)

## 2020-12-20 LAB — LIPID PANEL
Chol/HDL Ratio: 5.6 ratio — ABNORMAL HIGH (ref 0.0–4.4)
Cholesterol, Total: 247 mg/dL — ABNORMAL HIGH (ref 100–199)
HDL: 44 mg/dL (ref >39)
LDL Chol Calc (NIH): 173 mg/dL — ABNORMAL HIGH (ref 0–99)
Triglycerides: 162 mg/dL — ABNORMAL HIGH (ref 0–149)
VLDL Cholesterol Cal: 30 mg/dL (ref 5–40)

## 2020-12-20 LAB — CBC
Hematocrit: 46.8 % — ABNORMAL HIGH (ref 34.0–46.6)
Hemoglobin: 15.5 g/dL (ref 11.1–15.9)
MCH: 28 pg (ref 26.6–33.0)
MCHC: 33.1 g/dL (ref 31.5–35.7)
MCV: 85 fL (ref 79–97)
Platelets: 299 10*3/uL (ref 150–450)
RBC: 5.53 x10E6/uL — ABNORMAL HIGH (ref 3.77–5.28)
RDW: 13.3 % (ref 11.7–15.4)
WBC: 7.2 10*3/uL (ref 3.4–10.8)

## 2020-12-20 LAB — TSH: TSH: 0.68 u[IU]/mL (ref 0.450–4.500)

## 2020-12-20 MED ORDER — EZETIMIBE 10 MG PO TABS: 10.0000 mg | ORAL_TABLET | Freq: Every day | ORAL | 1 refills | Status: DC

## 2020-12-27 ENCOUNTER — Other Ambulatory Visit: Payer: Self-pay

## 2020-12-27 ENCOUNTER — Ambulatory Visit
Admission: RE | Admit: 2020-12-27 | Discharge: 2020-12-27 | Disposition: A | Discharge status: Home / Self Care | Payer: Medicare PPO | Source: Ambulatory Visit | Attending: Family Medicine | Admitting: Family Medicine

## 2020-12-27 DIAGNOSIS — Z1231 Encounter for screening mammogram for malignant neoplasm of breast: Secondary | ICD-10-CM | POA: Diagnosis not present

## 2021-01-10 ENCOUNTER — Telehealth (INDEPENDENT_AMBULATORY_CARE_PROVIDER_SITE_OTHER): Payer: Medicare PPO | Admitting: Internal Medicine

## 2021-01-10 ENCOUNTER — Encounter: Payer: Self-pay | Admitting: Internal Medicine

## 2021-01-10 VITALS — Temp 98.0°F

## 2021-01-10 DIAGNOSIS — J069 Acute upper respiratory infection, unspecified: Secondary | ICD-10-CM | POA: Insufficient documentation

## 2021-01-10 DIAGNOSIS — R059 Cough, unspecified: Secondary | ICD-10-CM

## 2021-01-10 MED ORDER — BENZONATATE 100 MG PO CAPS: 100.0000 mg | ORAL_CAPSULE | Freq: Two times a day (BID) | ORAL | 0 refills | Status: DC | PRN

## 2021-01-10 MED ORDER — AZITHROMYCIN 250 MG PO TABS: ORAL_TABLET | ORAL | 0 refills | Status: AC

## 2021-01-10 MED ORDER — FEXOFENADINE HCL 180 MG PO TABS: 180.0000 mg | ORAL_TABLET | Freq: Every day | ORAL | 1 refills | Status: AC

## 2021-01-10 NOTE — Telephone Encounter (Signed)
Scheduled patient with [facility] for today 07/2 at 2:40pm. Virtual visit. Patient was advised of time and how to log in to [facility] for the video visit.

## 2021-01-10 NOTE — Progress Notes (Signed)
Temp 98 F (36.7 C) (Oral)    Subjective:    Patient ID: Bianca Brown, female    DOB: 03-06-1951, 69 y.o.   MRN: [id number]  Chief Complaint  Patient presents with   Cough   Nasal Congestion    Started on last Thursday, Husband had this around July 4th. He tested negative   Sinus pressure    HPI: Bianca Brown is a 70 y.o. female   This visit was completed via video visit through MyChart due to the restrictions of the COVID-19 pandemic. All issues as above were discussed and addressed. Physical exam was done as above through visual confirmation on video through MyChart. If it was felt that the patient should be evaluated in the office, they were directed there. The patient verbally consented to this visit. Location of the patient: home Location of the provider: work Those involved with this call:  Provider: Charlynne Cousins, MD CMA: Frazier Butt, [place] Desk/Registration: Roe Ruther[place]  Time spent on call: 10 minutes with patient face to face via video conference. More than 50% of this time was spent in counseling and coordination of care. 10 minutes total spent in review of patient's record and preparation of their chart.  Husband was -ve for covid and flu when to [facility] for such    Cough This is a new (coughing worse + has had a purulent cough with greenish yellow. no fever 99 .3 nose from the nose is green as wlel.) problem. The current episode started in the past 7 days. The problem has been gradually worsening. Associated symptoms include chills, a fever, headaches and nasal congestion. Pertinent negatives include no chest pain, ear congestion, ear pain, heartburn, postnasal drip, rash, rhinorrhea, sore throat, shortness of breath, sweats, weight loss or wheezing.   Chief Complaint  Patient presents with   Cough   Nasal Congestion    Started on last Thursday, Husband had this around July 4th. He tested negative   Sinus pressure    Relevant past medical, surgical,  family and social history reviewed and updated as indicated. Interim medical history since our last visit reviewed. Allergies and medications reviewed and updated.  Review of Systems  Constitutional:  Positive for chills and fever. Negative for weight loss.  HENT:  Negative for ear pain, postnasal drip, rhinorrhea and sore throat.   Respiratory:  Positive for cough. Negative for shortness of breath and wheezing.   Cardiovascular:  Negative for chest pain.  Gastrointestinal:  Negative for heartburn.  Skin:  Negative for rash.  Neurological:  Positive for headaches.   Per HPI unless specifically indicated above     Objective:    Temp 98 F (36.7 C) (Oral)   Wt Readings from Last 3 Encounters:  12/19/20 184 lb (83.5 kg)  12/15/19 183 lb (83 kg)  12/15/19 183 lb (83 kg)    Physical Exam Vitals and nursing note reviewed.  Constitutional:      General: She is not in acute distress.    Appearance: Normal appearance. She is not ill-appearing or diaphoretic.  HENT:     Head: Normocephalic and atraumatic.     Right Ear: Tympanic membrane and external ear normal. There is no impacted cerumen.     Left Ear: External ear normal.     Nose: No congestion or rhinorrhea.     Mouth/Throat:     Pharynx: No oropharyngeal exudate or posterior oropharyngeal erythema.  Eyes:     Conjunctiva/sclera: Conjunctivae normal.  Pupils: Pupils are equal, round, and reactive to light.  Cardiovascular:     Rate and Rhythm: Normal rate and regular rhythm.     Heart sounds: No murmur heard.   No friction rub. No gallop.  Pulmonary:     [place]: No respiratory distress.     Breath sounds: No stridor. No wheezing or rhonchi.  Chest:     Chest wall: No tenderness.  Abdominal:     General: Abdomen is flat. Bowel sounds are normal. There is no distension.     Palpations: Abdomen is soft. There is no mass.     Tenderness: There is no abdominal tenderness. There is no guarding.  Musculoskeletal:         General: No swelling or deformity.     Cervical back: Normal range of motion and neck supple. No rigidity or tenderness.     Right lower leg: No edema.     Left lower leg: No edema.  Skin:    General: Skin is warm and dry.     Coloration: Skin is not jaundiced.     Findings: No erythema.  Neurological:     Mental Status: She is alert and oriented to person, place, and time. Mental status is at baseline.  Psychiatric:        Mood and Affect: Mood normal.        Behavior: Behavior normal.        Thought Content: Thought content normal.        Judgment: Judgment normal.          Current Outpatient Medications:    aspirin EC 325 MG tablet, Take 325 mg by mouth every 6 (six) hours as needed., Disp: , Rfl:    ezetimibe (ZETIA) 10 MG tablet, Take 1 tablet (10 mg total) by mouth daily., Disp: 90 tablet, Rfl: 1   fluticasone (FLONASE) 50 MCG/ACT nasal spray, Place 2 sprays into both nostrils daily., Disp: 16 g, Rfl: 6    Assessment & Plan:  COVID : Increase fluid intake. To start zpak.  Tessalon pearls for cough She will need to take a home test, declines a PCR at this time. Headahce - tyelnol every 4-6 hrs prn and alternate this with ibubrufen 800 mg q 8 hrly. Sinus pressure: use steam inhalation.  OTC -  Allegra / claritin.     Problem List Items Addressed This Visit   None    No orders of the defined types were placed in this encounter.    Meds ordered this encounter  Medications   benzonatate (TESSALON) 100 MG capsule    Sig: Take 1 capsule (100 mg total) by mouth 2 (two) times daily as needed for cough.    Dispense:  20 capsule    Refill:  0   azithromycin (ZITHROMAX) 250 MG tablet    Sig: Take 2 tablets on day 1, then 1 tablet daily on days 2 through 5    Dispense:  6 tablet    Refill:  0     Follow up plan: No follow-ups on file.

## 2021-01-23 ENCOUNTER — Telehealth: Payer: Self-pay

## 2021-01-23 DIAGNOSIS — J069 Acute upper respiratory infection, unspecified: Secondary | ICD-10-CM

## 2021-01-23 MED ORDER — CEFDINIR 300 MG PO CAPS: 600.0000 mg | ORAL_CAPSULE | Freq: Every day | ORAL | 0 refills | Status: AC

## 2021-01-23 NOTE — Telephone Encounter (Signed)
Needs to try a different antibiotic. Have sent prescription for cefdinir to [facility]. If not much better by Friday morning will need to call for order for chest xr

## 2021-01-23 NOTE — Telephone Encounter (Signed)
Patient advised and verbalized understanding 

## 2021-01-23 NOTE — Telephone Encounter (Signed)
Copied from [place] [id number]. Topic: General - Other >> Jan 23, 2021 11:44 AM Yvette Rack wrote: Reason for CRM: Pt stated she completed the medication that she was prescribed by Dr. Neomia Dear at [facility] but she still has not gotten any better. Pt requests call back to advise what she should do next.

## 2021-01-27 ENCOUNTER — Telehealth: Payer: Self-pay

## 2021-02-07 ENCOUNTER — Telehealth: Payer: Self-pay | Admitting: Family Medicine

## 2021-02-07 ENCOUNTER — Ambulatory Visit: Payer: Medicare PPO | Admitting: Family Medicine

## 2021-02-07 ENCOUNTER — Ambulatory Visit: Payer: Self-pay | Admitting: *Deleted

## 2021-02-07 DIAGNOSIS — R059 Cough, unspecified: Secondary | ICD-10-CM

## 2021-02-07 NOTE — Telephone Encounter (Signed)
Dry cough not productive, dull pain on the right side, symptoms have not improved from 01/10/2021 virtual visit. Patient states she does not think she needs any more medicine and would like PCP to order x ray.    [facility] [place], [place] AT [facility] OF [address] [facility] Phone:  [phone]  Fax:  [phone]

## 2021-02-07 NOTE — Telephone Encounter (Signed)
Answered question for the agent.   Did not need to speak with pt.

## 2021-02-08 NOTE — Telephone Encounter (Signed)
Pt calling back as she has not got a call back. Would like to proceed with an xray if the Dr feels like appropriate. Pt is experiencing a dull pain on the right.  Pt has a trip next week and was hoping to be well

## 2021-02-08 NOTE — Telephone Encounter (Signed)
Order has been placed for chest xr

## 2021-02-10 NOTE — Telephone Encounter (Signed)
Advised patient as below. Patient reports that she is feeling better, and the dull pain in her chest has went away. She doesn't feel she needs the CXR anymore. However, she does have hoarseness from coughing so much. The cough is not productive. She wanted to know any recommendations for dry mouth and hoarseness? Please advise. Thanks!

## 2021-03-10 ENCOUNTER — Encounter: Payer: Self-pay | Admitting: Family Medicine

## 2021-03-10 ENCOUNTER — Other Ambulatory Visit: Payer: Self-pay | Admitting: Family Medicine

## 2021-03-10 DIAGNOSIS — E7800 Pure hypercholesterolemia, unspecified: Secondary | ICD-10-CM

## 2021-03-20 DIAGNOSIS — E7800 Pure hypercholesterolemia, unspecified: Secondary | ICD-10-CM | POA: Diagnosis not present

## 2021-03-21 LAB — LIPID PANEL
Chol/HDL Ratio: 5.3 ratio — ABNORMAL HIGH (ref 0.0–4.4)
Cholesterol, Total: 200 mg/dL — ABNORMAL HIGH (ref 100–199)
HDL: 38 mg/dL — ABNORMAL LOW (ref >39)
LDL Chol Calc (NIH): 135 mg/dL — ABNORMAL HIGH (ref 0–99)
Triglycerides: 151 mg/dL — ABNORMAL HIGH (ref 0–149)
VLDL Cholesterol Cal: 27 mg/dL (ref 5–40)

## 2021-03-23 ENCOUNTER — Other Ambulatory Visit: Payer: Self-pay | Admitting: Family Medicine

## 2021-03-23 DIAGNOSIS — E7800 Pure hypercholesterolemia, unspecified: Secondary | ICD-10-CM

## 2021-05-15 ENCOUNTER — Other Ambulatory Visit: Payer: Self-pay

## 2021-05-15 ENCOUNTER — Encounter: Payer: Self-pay | Admitting: Family Medicine

## 2021-05-15 ENCOUNTER — Ambulatory Visit (INDEPENDENT_AMBULATORY_CARE_PROVIDER_SITE_OTHER): Payer: Medicare PPO | Admitting: Family Medicine

## 2021-05-15 VITALS — BP 130/82 | HR 91 | Temp 98.2°F | Wt 178.0 lb

## 2021-05-15 DIAGNOSIS — J039 Acute tonsillitis, unspecified: Secondary | ICD-10-CM

## 2021-05-15 MED ORDER — AMOXICILLIN 500 MG PO CAPS: 1000.0000 mg | ORAL_CAPSULE | Freq: Three times a day (TID) | ORAL | 0 refills | Status: AC

## 2021-05-15 NOTE — Progress Notes (Signed)
Established patient visit   Patient: Bianca Brown   DOB: [birth year]   70 y.o. Female  MRN: [id number] Visit Date: 05/15/2021  Today's healthcare provider: Lelon Huh, MD   Chief Complaint  Patient presents with   URI   Subjective    URI  This is a new problem. The current episode started in the past 7 days. The problem has been gradually worsening. The maximum temperature recorded prior to her arrival was 101 - 101.9 F (Highest 101 a few days ago.). Associated symptoms include congestion, coughing, headaches, rhinorrhea, sinus pain and a sore throat. Pertinent negatives include no abdominal pain, chest pain, ear pain, nausea or vomiting. She has tried decongestant and acetaminophen for the symptoms. The treatment provided mild relief.       Medications: Outpatient Medications Prior to Visit  Medication Sig   aspirin EC 325 MG tablet Take 325 mg by mouth every 6 (six) hours as needed.   ezetimibe (ZETIA) 10 MG tablet TAKE 1 TABLET(10 MG) BY MOUTH DAILY   fexofenadine (ALLEGRA ALLERGY) 180 MG tablet Take 1 tablet (180 mg total) by mouth daily.   [DISCONTINUED] benzonatate (TESSALON) 100 MG capsule Take 1 capsule (100 mg total) by mouth 2 (two) times daily as needed for cough.   fluticasone (FLONASE) 50 MCG/ACT nasal spray Place 2 sprays into both nostrils daily. (Patient not taking: Reported on 05/15/2021)   No facility-administered medications prior to visit.    Review of Systems  Constitutional:  Positive for fever. Negative for appetite change, chills and fatigue.  HENT:  Positive for congestion, postnasal drip, rhinorrhea, sinus pressure, sinus pain and sore throat. Negative for ear discharge, ear pain, hearing loss and tinnitus.   Eyes:  Positive for discharge. Negative for photophobia, pain, redness, itching and visual disturbance.  Respiratory:  Positive for cough. Negative for chest tightness and shortness of breath.   Cardiovascular:  Negative for chest pain  and palpitations.  Gastrointestinal: Negative.  Negative for abdominal pain, nausea and vomiting.  Musculoskeletal:  Positive for myalgias.  Neurological:  Positive for light-headedness and headaches. Negative for dizziness and weakness.      Objective    BP 130/82 (BP Location: Right Arm, Patient Position: Sitting, Cuff Size: Large)   Pulse 91   Temp 98.2 F (36.8 C) (Oral)   Wt 178 lb (80.7 kg)   SpO2 98%   BMI 28.73 kg/m    Physical Exam  General Appearance:     Well developed, well nourished female, alert, cooperative, in no acute distress  HENT:   bilateral TM normal without fluid or infection, neck has bilateral anterior cervical nodes enlarged, and bilaterally enlarged inflamed tonsils.   Eyes:    PERRL, conjunctiva/corneas clear, EOM's intact       Lungs:     Clear to auscultation bilaterally, respirations unlabored  Heart:    Normal heart rate. Normal rhythm. No murmurs, rubs, or gallops.    Neurologic:   Awake, alert, oriented x 3. No apparent focal neurological           defect.          Assessment & Plan     1. Tonsillitis  - amoxicillin (AMOXIL) 500 MG capsule; Take 2 capsules (1,000 mg total) by mouth 3 (three) times daily for 5 days.  Dispense: 30 capsule; Refill: 0   Call if symptoms change or if not rapidly improving.        The entirety of the information documented  in the History of Present Illness, Review of Systems and Physical Exam were personally obtained by me. Portions of this information were initially documented by the CMA and reviewed by me for thoroughness and accuracy.     Lelon Huh, MD  [facility] [phone] (phone) [phone] (fax)  [place]

## 2021-06-19 ENCOUNTER — Telehealth: Payer: Self-pay | Admitting: Family Medicine

## 2021-06-19 DIAGNOSIS — E7800 Pure hypercholesterolemia, unspecified: Secondary | ICD-10-CM

## 2021-06-19 MED ORDER — EZETIMIBE 10 MG PO TABS
10.0000 mg | ORAL_TABLET | Freq: Every day | ORAL | 1 refills | Status: DC
Start: 2021-06-19 — End: 2021-12-19

## 2021-06-19 NOTE — Telephone Encounter (Signed)
[facility] faxed refill request for the following medications:  ezetimibe (ZETIA) 10 MG tablet   Please advise.

## 2021-11-17 DIAGNOSIS — H0100A Unspecified blepharitis right eye, upper and lower eyelids: Secondary | ICD-10-CM | POA: Diagnosis not present

## 2021-11-17 DIAGNOSIS — Z0100 Encounter for examination of eyes and vision without abnormal findings: Secondary | ICD-10-CM | POA: Diagnosis not present

## 2021-11-17 DIAGNOSIS — H2513 Age-related nuclear cataract, bilateral: Secondary | ICD-10-CM | POA: Diagnosis not present

## 2021-11-17 DIAGNOSIS — H43813 Vitreous degeneration, bilateral: Secondary | ICD-10-CM | POA: Diagnosis not present

## 2021-11-17 DIAGNOSIS — M3501 Sicca syndrome with keratoconjunctivitis: Secondary | ICD-10-CM | POA: Diagnosis not present

## 2021-12-14 ENCOUNTER — Telehealth: Payer: Self-pay | Admitting: Family Medicine

## 2021-12-14 NOTE — Telephone Encounter (Signed)
Copied from [place] [id number]. Topic: Medicare AWV >> Dec 14, 2021  2:26 PM Jae Dire wrote: Reason for CRM:  No answer unable to leave a  message for patient to call back and schedule Medicare Annual Wellness Visit (AWV) in office.   If unable to come into the office for AWV,  please offer to do virtually or by telephone.  Last AWV: : 12/19/2020  Please schedule at anytime with [facility] Health Advisor.  30 minute appointment for Virtual or phone 45 minute appointment for in office or Initial virtual/phone  Any questions, please contact me at [phone]

## 2021-12-19 ENCOUNTER — Other Ambulatory Visit: Payer: Self-pay | Admitting: Family Medicine

## 2021-12-19 DIAGNOSIS — E7800 Pure hypercholesterolemia, unspecified: Secondary | ICD-10-CM

## 2022-02-08 ENCOUNTER — Telehealth: Payer: Self-pay | Admitting: Family Medicine

## 2022-02-08 NOTE — Telephone Encounter (Signed)
Patient declined the Medicare Wellness Visit with [facility]  

## 2022-06-12 ENCOUNTER — Other Ambulatory Visit: Payer: Self-pay | Admitting: Family Medicine

## 2022-06-12 DIAGNOSIS — E7800 Pure hypercholesterolemia, unspecified: Secondary | ICD-10-CM

## 2022-06-12 NOTE — Telephone Encounter (Signed)
Requested medication (s) are due for refill today: routing for review  Requested medication (s) are on the active medication list: yes  Last refill:  no  Future visit scheduled:no  Notes to clinic:  Unable to refill per protocol due to failed labs, no updated results.      Requested Prescriptions  Pending Prescriptions Disp Refills   ezetimibe (ZETIA) 10 MG tablet [Pharmacy Med Name: EZETIMIBE '10MG'$  TABLETS] 90 tablet 1    Sig: TAKE 1 TABLET(10 MG) BY MOUTH DAILY     Cardiovascular:  Antilipid - Sterol Transport Inhibitors Failed - 06/12/2022  3:15 AM      Failed - AST in normal range and within 360 days    AST  Date Value Ref Range Status  12/19/2020 13 0 - 40 IU/L Final         Failed - ALT in normal range and within 360 days    ALT  Date Value Ref Range Status  12/19/2020 15 0 - 32 IU/L Final         Failed - Valid encounter within last 12 months    Recent Outpatient Visits           1 year ago [place], Donald E, MD   1 year ago Cough   [facility] Vigg, Avanti, MD   1 year ago Annual physical exam   [facility] Birdie Sons, MD   2 years ago Annual physical exam   [facility] Birdie Sons, MD   5 years ago [place], Donald E, MD              Failed - Lipid Panel in normal range within the last 12 months    Cholesterol, Total  Date Value Ref Range Status  03/20/2021 200 (H) 100 - 199 mg/dL Final   LDL Chol Calc (NIH)  Date Value Ref Range Status  03/20/2021 135 (H) 0 - 99 mg/dL Final   HDL  Date Value Ref Range Status  03/20/2021 38 (L) >39 mg/dL Final   Triglycerides  Date Value Ref Range Status  03/20/2021 151 (H) 0 - 149 mg/dL Final         Passed - Patient is not pregnant

## 2022-08-28 DIAGNOSIS — R7309 Other abnormal glucose: Secondary | ICD-10-CM | POA: Diagnosis not present

## 2022-08-28 DIAGNOSIS — Z131 Encounter for screening for diabetes mellitus: Secondary | ICD-10-CM | POA: Diagnosis not present

## 2022-08-28 DIAGNOSIS — E7800 Pure hypercholesterolemia, unspecified: Secondary | ICD-10-CM | POA: Diagnosis not present

## 2022-08-28 DIAGNOSIS — Z1329 Encounter for screening for other suspected endocrine disorder: Secondary | ICD-10-CM | POA: Diagnosis not present

## 2022-08-28 DIAGNOSIS — Z6829 Body mass index (BMI) 29.0-29.9, adult: Secondary | ICD-10-CM | POA: Diagnosis not present

## 2022-08-28 DIAGNOSIS — E559 Vitamin D deficiency, unspecified: Secondary | ICD-10-CM | POA: Diagnosis not present

## 2022-08-28 DIAGNOSIS — Z1321 Encounter for screening for nutritional disorder: Secondary | ICD-10-CM | POA: Diagnosis not present

## 2022-08-28 DIAGNOSIS — Z13228 Encounter for screening for other metabolic disorders: Secondary | ICD-10-CM | POA: Diagnosis not present

## 2022-08-28 DIAGNOSIS — Z13220 Encounter for screening for lipoid disorders: Secondary | ICD-10-CM | POA: Diagnosis not present

## 2022-08-28 DIAGNOSIS — N8189 Other female genital prolapse: Secondary | ICD-10-CM | POA: Diagnosis not present

## 2022-08-28 DIAGNOSIS — Z779 Other contact with and (suspected) exposures hazardous to health: Secondary | ICD-10-CM | POA: Diagnosis not present

## 2022-08-28 DIAGNOSIS — Z780 Asymptomatic menopausal state: Secondary | ICD-10-CM | POA: Diagnosis not present

## 2022-08-28 DIAGNOSIS — Z853 Personal history of malignant neoplasm of breast: Secondary | ICD-10-CM | POA: Diagnosis not present

## 2022-08-29 ENCOUNTER — Other Ambulatory Visit: Payer: Self-pay | Admitting: Obstetrics and Gynecology

## 2022-08-29 DIAGNOSIS — E041 Nontoxic single thyroid nodule: Secondary | ICD-10-CM

## 2022-09-11 ENCOUNTER — Other Ambulatory Visit: Payer: Self-pay | Admitting: Obstetrics and Gynecology

## 2022-09-11 DIAGNOSIS — Z1231 Encounter for screening mammogram for malignant neoplasm of breast: Secondary | ICD-10-CM

## 2022-09-18 DIAGNOSIS — M1612 Unilateral primary osteoarthritis, left hip: Secondary | ICD-10-CM | POA: Diagnosis not present

## 2022-09-18 DIAGNOSIS — M7062 Trochanteric bursitis, left hip: Secondary | ICD-10-CM | POA: Diagnosis not present

## 2022-09-19 DIAGNOSIS — N958 Other specified menopausal and perimenopausal disorders: Secondary | ICD-10-CM | POA: Diagnosis not present

## 2022-09-19 DIAGNOSIS — M810 Age-related osteoporosis without current pathological fracture: Secondary | ICD-10-CM | POA: Diagnosis not present

## 2022-09-19 DIAGNOSIS — Z780 Asymptomatic menopausal state: Secondary | ICD-10-CM | POA: Diagnosis not present

## 2022-09-19 DIAGNOSIS — N8189 Other female genital prolapse: Secondary | ICD-10-CM | POA: Diagnosis not present

## 2022-09-19 DIAGNOSIS — M8588 Other specified disorders of bone density and structure, other site: Secondary | ICD-10-CM | POA: Diagnosis not present

## 2022-09-26 ENCOUNTER — Ambulatory Visit
Admission: RE | Admit: 2022-09-26 | Discharge: 2022-09-26 | Disposition: A | Discharge status: Home / Self Care | Payer: Medicare PPO | Source: Ambulatory Visit | Attending: Obstetrics and Gynecology | Admitting: Obstetrics and Gynecology

## 2022-09-26 DIAGNOSIS — Z1231 Encounter for screening mammogram for malignant neoplasm of breast: Secondary | ICD-10-CM | POA: Insufficient documentation

## 2022-09-26 DIAGNOSIS — M1612 Unilateral primary osteoarthritis, left hip: Secondary | ICD-10-CM | POA: Diagnosis not present

## 2022-10-18 DIAGNOSIS — M1612 Unilateral primary osteoarthritis, left hip: Secondary | ICD-10-CM | POA: Diagnosis not present

## 2022-11-07 DIAGNOSIS — M5451 Vertebrogenic low back pain: Secondary | ICD-10-CM | POA: Diagnosis not present

## 2022-12-03 DIAGNOSIS — M5451 Vertebrogenic low back pain: Secondary | ICD-10-CM | POA: Diagnosis not present

## 2022-12-18 DIAGNOSIS — M5451 Vertebrogenic low back pain: Secondary | ICD-10-CM | POA: Diagnosis not present

## 2022-12-26 DIAGNOSIS — M5451 Vertebrogenic low back pain: Secondary | ICD-10-CM | POA: Diagnosis not present

## 2023-01-01 DIAGNOSIS — M5451 Vertebrogenic low back pain: Secondary | ICD-10-CM | POA: Diagnosis not present

## 2023-03-07 DIAGNOSIS — Z79899 Other long term (current) drug therapy: Secondary | ICD-10-CM | POA: Diagnosis not present

## 2023-03-07 DIAGNOSIS — R030 Elevated blood-pressure reading, without diagnosis of hypertension: Secondary | ICD-10-CM | POA: Diagnosis not present

## 2023-03-07 DIAGNOSIS — Z23 Encounter for immunization: Secondary | ICD-10-CM | POA: Diagnosis not present

## 2023-03-07 DIAGNOSIS — E7800 Pure hypercholesterolemia, unspecified: Secondary | ICD-10-CM | POA: Diagnosis not present

## 2023-03-07 DIAGNOSIS — Z0000 Encounter for general adult medical examination without abnormal findings: Secondary | ICD-10-CM | POA: Diagnosis not present

## 2023-03-14 ENCOUNTER — Other Ambulatory Visit: Payer: Self-pay | Admitting: Family Medicine

## 2023-03-14 DIAGNOSIS — E785 Hyperlipidemia, unspecified: Secondary | ICD-10-CM

## 2023-03-18 ENCOUNTER — Ambulatory Visit
Admission: RE | Admit: 2023-03-18 | Discharge: 2023-03-18 | Disposition: A | Discharge status: Home / Self Care | Payer: Medicare PPO | Source: Ambulatory Visit | Attending: Family Medicine | Admitting: Family Medicine

## 2023-03-18 DIAGNOSIS — E785 Hyperlipidemia, unspecified: Secondary | ICD-10-CM | POA: Insufficient documentation

## 2023-05-07 DIAGNOSIS — H2513 Age-related nuclear cataract, bilateral: Secondary | ICD-10-CM | POA: Diagnosis not present

## 2023-05-07 DIAGNOSIS — H52223 Regular astigmatism, bilateral: Secondary | ICD-10-CM | POA: Diagnosis not present

## 2023-05-07 DIAGNOSIS — H43813 Vitreous degeneration, bilateral: Secondary | ICD-10-CM | POA: Diagnosis not present

## 2023-06-04 DIAGNOSIS — D225 Melanocytic nevi of trunk: Secondary | ICD-10-CM | POA: Diagnosis not present

## 2023-06-04 DIAGNOSIS — D485 Neoplasm of uncertain behavior of skin: Secondary | ICD-10-CM | POA: Diagnosis not present

## 2023-06-04 DIAGNOSIS — L820 Inflamed seborrheic keratosis: Secondary | ICD-10-CM | POA: Diagnosis not present

## 2023-06-04 DIAGNOSIS — L814 Other melanin hyperpigmentation: Secondary | ICD-10-CM | POA: Diagnosis not present

## 2023-06-04 DIAGNOSIS — D0359 Melanoma in situ of other part of trunk: Secondary | ICD-10-CM | POA: Diagnosis not present

## 2023-06-04 DIAGNOSIS — L821 Other seborrheic keratosis: Secondary | ICD-10-CM | POA: Diagnosis not present

## 2023-06-24 DIAGNOSIS — D0359 Melanoma in situ of other part of trunk: Secondary | ICD-10-CM | POA: Diagnosis not present

## 2023-06-24 DIAGNOSIS — L905 Scar conditions and fibrosis of skin: Secondary | ICD-10-CM | POA: Diagnosis not present

## 2023-11-06 ENCOUNTER — Other Ambulatory Visit: Payer: Self-pay | Admitting: Physician Assistant

## 2023-11-06 ENCOUNTER — Ambulatory Visit
Admission: RE | Admit: 2023-11-06 | Discharge: 2023-11-06 | Disposition: A | Discharge status: Home / Self Care | Source: Ambulatory Visit | Attending: Physician Assistant | Admitting: Physician Assistant

## 2023-11-06 ENCOUNTER — Other Ambulatory Visit: Payer: Self-pay | Admitting: Obstetrics and Gynecology

## 2023-11-06 DIAGNOSIS — Z6824 Body mass index (BMI) 24.0-24.9, adult: Secondary | ICD-10-CM | POA: Diagnosis not present

## 2023-11-06 DIAGNOSIS — N8189 Other female genital prolapse: Secondary | ICD-10-CM | POA: Diagnosis not present

## 2023-11-06 DIAGNOSIS — M1611 Unilateral primary osteoarthritis, right hip: Secondary | ICD-10-CM | POA: Diagnosis not present

## 2023-11-06 DIAGNOSIS — E041 Nontoxic single thyroid nodule: Secondary | ICD-10-CM

## 2023-11-06 DIAGNOSIS — Z780 Asymptomatic menopausal state: Secondary | ICD-10-CM | POA: Diagnosis not present

## 2023-11-06 DIAGNOSIS — M25551 Pain in right hip: Secondary | ICD-10-CM | POA: Diagnosis present

## 2023-11-06 DIAGNOSIS — S32591A Other specified fracture of right pubis, initial encounter for closed fracture: Secondary | ICD-10-CM | POA: Diagnosis not present

## 2023-11-06 DIAGNOSIS — Z01419 Encounter for gynecological examination (general) (routine) without abnormal findings: Secondary | ICD-10-CM | POA: Diagnosis not present

## 2023-11-12 DIAGNOSIS — S32591A Other specified fracture of right pubis, initial encounter for closed fracture: Secondary | ICD-10-CM | POA: Diagnosis not present

## 2023-11-12 DIAGNOSIS — M5451 Vertebrogenic low back pain: Secondary | ICD-10-CM | POA: Diagnosis not present

## 2023-11-26 DIAGNOSIS — M7061 Trochanteric bursitis, right hip: Secondary | ICD-10-CM | POA: Diagnosis not present

## 2023-11-26 DIAGNOSIS — S32591A Other specified fracture of right pubis, initial encounter for closed fracture: Secondary | ICD-10-CM | POA: Diagnosis not present

## 2023-11-26 DIAGNOSIS — M5451 Vertebrogenic low back pain: Secondary | ICD-10-CM | POA: Diagnosis not present

## 2023-12-05 ENCOUNTER — Encounter: Payer: Self-pay | Admitting: Family Medicine

## 2023-12-05 ENCOUNTER — Other Ambulatory Visit: Payer: Self-pay | Admitting: Family Medicine

## 2023-12-05 DIAGNOSIS — Z1231 Encounter for screening mammogram for malignant neoplasm of breast: Secondary | ICD-10-CM

## 2023-12-12 DIAGNOSIS — M25551 Pain in right hip: Secondary | ICD-10-CM | POA: Diagnosis not present

## 2023-12-19 ENCOUNTER — Ambulatory Visit
Admission: RE | Admit: 2023-12-19 | Discharge: 2023-12-19 | Disposition: A | Discharge status: Home / Self Care | Source: Ambulatory Visit | Attending: Family Medicine | Admitting: Family Medicine

## 2023-12-19 DIAGNOSIS — Z1231 Encounter for screening mammogram for malignant neoplasm of breast: Secondary | ICD-10-CM | POA: Diagnosis not present

## 2023-12-23 DIAGNOSIS — Z789 Other specified health status: Secondary | ICD-10-CM | POA: Diagnosis not present

## 2023-12-23 DIAGNOSIS — L538 Other specified erythematous conditions: Secondary | ICD-10-CM | POA: Diagnosis not present

## 2023-12-23 DIAGNOSIS — L814 Other melanin hyperpigmentation: Secondary | ICD-10-CM | POA: Diagnosis not present

## 2023-12-23 DIAGNOSIS — Z85820 Personal history of malignant melanoma of skin: Secondary | ICD-10-CM | POA: Diagnosis not present

## 2023-12-23 DIAGNOSIS — Z08 Encounter for follow-up examination after completed treatment for malignant neoplasm: Secondary | ICD-10-CM | POA: Diagnosis not present

## 2023-12-23 DIAGNOSIS — L821 Other seborrheic keratosis: Secondary | ICD-10-CM | POA: Diagnosis not present

## 2023-12-23 DIAGNOSIS — D2372 Other benign neoplasm of skin of left lower limb, including hip: Secondary | ICD-10-CM | POA: Diagnosis not present

## 2023-12-23 DIAGNOSIS — L820 Inflamed seborrheic keratosis: Secondary | ICD-10-CM | POA: Diagnosis not present

## 2023-12-25 DIAGNOSIS — M25551 Pain in right hip: Secondary | ICD-10-CM | POA: Diagnosis not present

## 2023-12-31 DIAGNOSIS — M25551 Pain in right hip: Secondary | ICD-10-CM | POA: Diagnosis not present

## 2024-01-02 DIAGNOSIS — M25551 Pain in right hip: Secondary | ICD-10-CM | POA: Diagnosis not present

## 2024-01-07 DIAGNOSIS — S32810A Multiple fractures of pelvis with stable disruption of pelvic ring, initial encounter for closed fracture: Secondary | ICD-10-CM | POA: Diagnosis not present

## 2024-01-07 DIAGNOSIS — M25551 Pain in right hip: Secondary | ICD-10-CM | POA: Diagnosis not present

## 2024-01-09 DIAGNOSIS — M25551 Pain in right hip: Secondary | ICD-10-CM | POA: Diagnosis not present

## 2024-01-16 DIAGNOSIS — M25551 Pain in right hip: Secondary | ICD-10-CM | POA: Diagnosis not present

## 2024-01-20 ENCOUNTER — Telehealth: Payer: Self-pay

## 2024-01-20 ENCOUNTER — Other Ambulatory Visit: Payer: Self-pay

## 2024-01-20 DIAGNOSIS — E042 Nontoxic multinodular goiter: Secondary | ICD-10-CM | POA: Insufficient documentation

## 2024-01-20 DIAGNOSIS — Z860101 Personal history of adenomatous and serrated colon polyps: Secondary | ICD-10-CM

## 2024-01-20 DIAGNOSIS — Z800 Family history of malignant neoplasm of digestive organs: Secondary | ICD-10-CM

## 2024-01-20 DIAGNOSIS — Z853 Personal history of malignant neoplasm of breast: Secondary | ICD-10-CM | POA: Insufficient documentation

## 2024-01-20 DIAGNOSIS — R030 Elevated blood-pressure reading, without diagnosis of hypertension: Secondary | ICD-10-CM | POA: Insufficient documentation

## 2024-01-20 MED ORDER — NA SULFATE-K SULFATE-MG SULF 17.5-3.13-1.6 GM/177ML PO SOLN: 354.0000 mL | Freq: Once | ORAL | 0 refills | Status: AC

## 2024-01-20 NOTE — Telephone Encounter (Signed)
 Patient called but left a voicemail stating that she had a colonoscopy done on 06/02/2019 by Dr. Jinny and he had told her that she was due for another one five years from then. So, she was calling to schedule it. Therefore, I called her back and left her a voicemail letting her know that I was calling her back to schedule her procedure after 06/01/2024 with Dr. Jinny and to please call us  back to schedule for her.

## 2024-01-20 NOTE — Telephone Encounter (Signed)
 Called patient back to let her know that Dr. Jinny stated that it ws no problem doing her colonoscopy with a prolapsed  anus. Patient had no further questions.

## 2024-01-20 NOTE — Telephone Encounter (Signed)
 Patient called back and I went over her chart and medications and she agreed on scheduling her colonoscopy. Patient decided on having it done on 06/02/2024 with Dr. Jinny. However, patient stated that on April of this year, she was diagnosed with rectal prolapse going to vagina and that's why she was given a vaginal cream to strenthen her muscle. Patient would like to know if it was still okay to proceed with her colonoscopy by the end of the year or it wasn't recommended. I let her know that I did not know the answer but that I will call her to let her know once the physician replied back to her question. Patient agreed and had no further questions.

## 2024-01-21 DIAGNOSIS — M25551 Pain in right hip: Secondary | ICD-10-CM | POA: Diagnosis not present

## 2024-03-19 DIAGNOSIS — E7800 Pure hypercholesterolemia, unspecified: Secondary | ICD-10-CM | POA: Diagnosis not present

## 2024-03-19 DIAGNOSIS — Z23 Encounter for immunization: Secondary | ICD-10-CM | POA: Diagnosis not present

## 2024-03-19 DIAGNOSIS — R5383 Other fatigue: Secondary | ICD-10-CM | POA: Diagnosis not present

## 2024-03-19 DIAGNOSIS — Z79899 Other long term (current) drug therapy: Secondary | ICD-10-CM | POA: Diagnosis not present

## 2024-03-19 DIAGNOSIS — S32591D Other specified fracture of right pubis, subsequent encounter for fracture with routine healing: Secondary | ICD-10-CM | POA: Diagnosis not present

## 2024-03-19 DIAGNOSIS — N8110 Cystocele, unspecified: Secondary | ICD-10-CM | POA: Diagnosis not present

## 2024-03-19 DIAGNOSIS — R030 Elevated blood-pressure reading, without diagnosis of hypertension: Secondary | ICD-10-CM | POA: Diagnosis not present

## 2024-03-19 DIAGNOSIS — Z0000 Encounter for general adult medical examination without abnormal findings: Secondary | ICD-10-CM | POA: Diagnosis not present

## 2024-03-23 DIAGNOSIS — N8182 Incompetence or weakening of pubocervical tissue: Secondary | ICD-10-CM | POA: Diagnosis not present

## 2024-04-16 DIAGNOSIS — I8002 Phlebitis and thrombophlebitis of superficial vessels of left lower extremity: Secondary | ICD-10-CM | POA: Diagnosis not present

## 2024-04-16 DIAGNOSIS — Z8781 Personal history of (healed) traumatic fracture: Secondary | ICD-10-CM | POA: Diagnosis not present

## 2024-06-02 ENCOUNTER — Ambulatory Visit

## 2024-06-02 ENCOUNTER — Encounter: Payer: Self-pay | Admitting: Gastroenterology

## 2024-06-02 ENCOUNTER — Encounter
Admission: RE | Disposition: A | Discharge status: Home / Self Care | Payer: Self-pay | Source: Home / Self Care | Attending: Gastroenterology

## 2024-06-02 ENCOUNTER — Ambulatory Visit
Admission: RE | Admit: 2024-06-02 | Discharge: 2024-06-02 | Disposition: A | Discharge status: Home / Self Care | Attending: Gastroenterology | Admitting: Gastroenterology

## 2024-06-02 DIAGNOSIS — Z860100 Personal history of colon polyps, unspecified: Secondary | ICD-10-CM | POA: Diagnosis not present

## 2024-06-02 DIAGNOSIS — Z09 Encounter for follow-up examination after completed treatment for conditions other than malignant neoplasm: Secondary | ICD-10-CM | POA: Diagnosis not present

## 2024-06-02 DIAGNOSIS — Z1211 Encounter for screening for malignant neoplasm of colon: Secondary | ICD-10-CM

## 2024-06-02 DIAGNOSIS — Z800 Family history of malignant neoplasm of digestive organs: Secondary | ICD-10-CM

## 2024-06-02 DIAGNOSIS — K5730 Diverticulosis of large intestine without perforation or abscess without bleeding: Secondary | ICD-10-CM

## 2024-06-02 DIAGNOSIS — K640 First degree hemorrhoids: Secondary | ICD-10-CM

## 2024-06-02 DIAGNOSIS — Z860101 Personal history of adenomatous and serrated colon polyps: Secondary | ICD-10-CM

## 2024-06-02 HISTORY — DX: Essential (primary) hypertension: I10

## 2024-06-02 HISTORY — PX: COLONOSCOPY: SHX5424

## 2024-06-02 SURGERY — COLONOSCOPY
Anesthesia: General

## 2024-06-02 MED ORDER — LIDOCAINE HCL (CARDIAC) PF 100 MG/5ML IV SOSY
PREFILLED_SYRINGE | INTRAVENOUS | Status: DC | PRN
  Administered 2024-06-02: 100 mg via INTRAVENOUS

## 2024-06-02 MED ORDER — GLYCOPYRROLATE 0.2 MG/ML IJ SOLN
INTRAMUSCULAR | Status: AC
  Filled 2024-06-02: qty 1

## 2024-06-02 MED ORDER — SODIUM CHLORIDE 0.9 % IV SOLN: INTRAVENOUS | Status: DC

## 2024-06-02 MED ORDER — PROPOFOL 500 MG/50ML IV EMUL
INTRAVENOUS | Status: DC | PRN
  Administered 2024-06-02: 165 ug/kg/min via INTRAVENOUS

## 2024-06-02 MED ORDER — PROPOFOL 10 MG/ML IV BOLUS
INTRAVENOUS | Status: DC | PRN
  Administered 2024-06-02: 10 mg via INTRAVENOUS
  Administered 2024-06-02: 60 mg via INTRAVENOUS

## 2024-06-02 MED ORDER — GLYCOPYRROLATE 0.2 MG/ML IJ SOLN
INTRAMUSCULAR | Status: DC | PRN
  Administered 2024-06-02: .2 mg via INTRAVENOUS

## 2024-06-02 MED ORDER — GLYCOPYRROLATE 0.2 MG/ML IJ SOLN
INTRAMUSCULAR | Status: AC
  Filled 2024-06-02: qty 2

## 2024-06-02 MED ORDER — LIDOCAINE HCL (PF) 2 % IJ SOLN
INTRAMUSCULAR | Status: AC
  Filled 2024-06-02: qty 10

## 2024-06-02 MED ORDER — LIDOCAINE HCL (PF) 2 % IJ SOLN
INTRAMUSCULAR | Status: AC
  Filled 2024-06-02: qty 35
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
## 2024-06-02 NOTE — Op Note (Signed)
 [facility] Gastroenterology Patient Name: Bianca Brown Procedure Date: 06/02/2024 8:01 AM MRN: [id number] Account #: [id number] Date of Birth: Jun 14, 1951 Admit Type: Outpatient Age: 73 Room: [facility] ENDO ROOM 4 Gender: Female Note Status: Finalized Instrument Name: Colon Scope [id number] Procedure:             Colonoscopy Indications:           High risk colon cancer surveillance: Personal history                         of colonic polyps Providers:             Rogelia Copping MD, MD Referring MD:          Lamarr Rotunda (Referring MD) Medicines:             Propofol  per Anesthesia Complications:         No immediate complications. Procedure:             Pre-Anesthesia Assessment:                        - Prior to the procedure, a History and Physical was                         performed, and patient medications and allergies were                         reviewed. The patient's tolerance of previous                         anesthesia was also reviewed. The risks and benefits                         of the procedure and the sedation options and risks                         were discussed with the patient. All questions were                         answered, and informed consent was obtained. Prior                         Anticoagulants: The patient has taken no anticoagulant                         or antiplatelet agents. ASA Grade Assessment: II - A                         patient with mild systemic disease. After reviewing                         the risks and benefits, the patient was deemed in                         satisfactory condition to undergo the procedure.                        After obtaining informed consent, the colonoscope was  passed under direct vision. Throughout the procedure,                         the patient's blood pressure, pulse, and oxygen                         saturations were monitored continuously. The                          Colonoscope was introduced through the anus and                         advanced to the the cecum, identified by appendiceal                         orifice and ileocecal valve. The colonoscopy was                         performed without difficulty. The patient tolerated                         the procedure well. The quality of the bowel                         preparation was good. Findings:      The perianal and digital rectal examinations were normal.      Multiple small-mouthed diverticula were found in the sigmoid colon.      Non-bleeding internal hemorrhoids were found during retroflexion. The       hemorrhoids were Grade I (internal hemorrhoids that do not prolapse). Impression:            - Diverticulosis in the sigmoid colon.                        - Non-bleeding internal hemorrhoids.                        - No specimens collected. Recommendation:        - Discharge patient to home.                        - Resume previous diet.                        - Continue present medications.                        - Repeat colonoscopy is not recommended for                         surveillance. Procedure Code(s):     --- Professional ---                        [phone], Colonoscopy, flexible; diagnostic, including                         collection of specimen(s) by brushing or washing, when                         performed (separate procedure) Diagnosis Code(s):     ---  Professional ---                        Z86.010, Personal history of colonic polyps CPT copyright 2022 American Medical Association. All rights reserved. The codes documented in this report are preliminary and upon coder review may  be revised to meet current compliance requirements. Rogelia Copping MD, MD 06/02/2024 8:27:50 AM This report has been signed electronically. Number of Addenda: 0 Note Initiated On: 06/02/2024 8:01 AM Scope Withdrawal Time: 0 hours 8 minutes 54 seconds  Total Procedure  Duration: 0 hours 16 minutes 17 seconds  Estimated Blood Loss:  Estimated blood loss: none.      [facility]

## 2024-06-02 NOTE — Anesthesia Procedure Notes (Signed)
 Procedure Name: General with mask airway Date/Time: 06/02/2024 8:09 AM  Performed by: Ledora Duncan, CRNAPre-anesthesia Checklist: Patient identified, Emergency Drugs available, Suction available and Patient being monitored Patient Re-evaluated:Patient Re-evaluated prior to induction Oxygen Delivery Method: Simple face mask Induction Type: IV induction Placement Confirmation: positive ETCO2 and breath sounds checked- equal and bilateral Dental Injury: Teeth and Oropharynx as per pre-operative assessment

## 2024-06-02 NOTE — Transfer of Care (Signed)
 Immediate Anesthesia Transfer of Care Note  Patient: Bianca Brown  Procedure(s) Performed: COLONOSCOPY  Patient Location: Endoscopy Unit  Anesthesia Type:General  Level of Consciousness: drowsy and patient cooperative  Airway & Oxygen Therapy: Patient Spontanous Breathing and Patient connected to face mask oxygen  Post-op Assessment: Report given to RN and Post -op Vital signs reviewed and stable  Post vital signs: Reviewed and stable  Last Vitals:  Vitals Value Taken Time  BP 112/69 06/02/24 08:29  Temp 35.8 C 06/02/24 08:29  Pulse 90 06/02/24 08:30  Resp 15 06/02/24 08:30  SpO2 97 % 06/02/24 08:30  Vitals shown include unfiled device data.  Last Pain:  Vitals:   06/02/24 0829  TempSrc: Temporal  PainSc:          Complications: No notable events documented.
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
## 2024-06-16 NOTE — Anesthesia Postprocedure Evaluation (Signed)
 Anesthesia Post Note  Patient: Bianca Brown  Procedure(s) Performed: COLONOSCOPY  Patient location during evaluation: PACU Anesthesia Type: General Level of consciousness: awake and alert Pain management: pain level controlled Vital Signs Assessment: post-procedure vital signs reviewed and stable Respiratory status: spontaneous breathing, nonlabored ventilation, respiratory function stable and patient connected to nasal cannula oxygen Cardiovascular status: blood pressure returned to baseline and stable Postop Assessment: no apparent nausea or vomiting Anesthetic complications: no   No notable events documented.   Last Vitals:  Vitals:   06/02/24 0829 06/02/24 0839  BP: 112/69 122/74  Pulse: 89 94  Resp: 20 18  Temp: (!) 35.8 C   SpO2: 98% 98%    Last Pain:  Vitals:   06/02/24 0839  TempSrc:   PainSc: 0-No pain                 [place] KANDICE Clause
